# Patient Record
Sex: Female | Born: 1965
Health system: Southern US, Community
[De-identification: ages and names within clinical notes are randomized; demographics above are authoritative.]

## PROBLEM LIST (undated history)

## (undated) DIAGNOSIS — F32A Depression, unspecified: Secondary | ICD-10-CM

## (undated) DIAGNOSIS — E0590 Thyrotoxicosis, unspecified without thyrotoxic crisis or storm: Secondary | ICD-10-CM

## (undated) DIAGNOSIS — F329 Major depressive disorder, single episode, unspecified: Secondary | ICD-10-CM

## (undated) HISTORY — DX: Thyrotoxicosis, unspecified without thyrotoxic crisis or storm: E05.90

## (undated) HISTORY — PX: BRAIN SURGERY: SHX531

---
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
## 2014-04-22 ENCOUNTER — Ambulatory Visit (INDEPENDENT_AMBULATORY_CARE_PROVIDER_SITE_OTHER): Payer: Self-pay | Admitting: Endocrinology

## 2014-04-22 ENCOUNTER — Encounter: Payer: Self-pay | Admitting: Endocrinology

## 2014-04-22 VITALS — BP 124/80 | HR 54 | Temp 97.7°F | Ht 67.0 in | Wt 147.0 lb

## 2014-04-22 DIAGNOSIS — E0590 Thyrotoxicosis, unspecified without thyrotoxic crisis or storm: Secondary | ICD-10-CM

## 2014-04-22 MED ORDER — METHIMAZOLE 10 MG PO TABS: ORAL_TABLET | ORAL | Status: DC

## 2014-04-22 NOTE — Patient Instructions (Signed)
i have sent a prescription to your pharmacy, for a pill to slightly slow down the thyroid. if ever you have fever while taking methimazole, stop it and call us, because of the risk of a rare side-effect.   Please recheck the blood test in 4-6 weeks. Here is a letter to do it close to your home.  Please come back for a follow-up appointment in 6 months.

## 2014-04-22 NOTE — Progress Notes (Signed)
Subjective:    Patient ID: Shelley Hines, female    DOB: 1965/11/20, 48 y.o.   MRN: [id number]  HPI Pt was dx'ed with hypothyroidism in 2012.  She started taking synthroid, but she stopped it in late 2014, due to lack of insurance. She now reports slightly excessive diaphoresis throughout the body, at night, and assoc fatigue.   Past Medical History  Diagnosis Date  . Primary hyperthyroidism     No past surgical history on file.  History   Social History  . Marital Status: Divorced    Spouse Name: N/A    Number of Children: N/A  . Years of Education: N/A   Occupational History  . Not on file.   Social History Main Topics  . Smoking status: Never Smoker   . Smokeless tobacco: Not on file  . Alcohol Use: Yes  . Drug Use: Not on file  . Sexual Activity: Not on file   Other Topics Concern  . Not on file   Social History Narrative  . No narrative on file    No current outpatient prescriptions on file prior to visit.   No current facility-administered medications on file prior to visit.    Allergies  Allergen Reactions  . Codeine Hives, Itching and Swelling  . Fentanyl Citrate Itching  . Hydrocodone-Acetaminophen Hives, Itching and Swelling  . Oxycodone-Acetaminophen Hives, Itching, Nausea And Vomiting and Swelling  . Penicillins Hives, Itching and Swelling  . Tramadol Hives, Itching and Swelling  . Tramadol-Acetaminophen Hives, Itching and Swelling    Family History  Problem Relation Age of Onset  . Thyroid disease Neg Hx     BP 124/80  Pulse 54  Temp(Src) 97.7 F (36.5 C) (Oral)  Ht 5\' 7"  (1.702 m)  Wt 147 lb (66.679 kg)  BMI 23.02 kg/m2  SpO2 97%   Review of Systems denies weight loss, headache, hoarseness, double vision, palpitations, sob, diarrhea, polyuria, myalgias, numbness, tremor, anxiety, and rhinorrhea.  She still has menses.  She has easy bruising.    Objective:   Physical Exam VS: see vs page GEN: no distress HEAD: head: no  deformity eyes: no periorbital swelling; there is slight bilat proptosis external nose and ears are normal mouth: no lesion seen NECK: supple, thyroid is not enlarged CHEST WALL: no deformity LUNGS:  Clear to auscultation CV: reg rate and rhythm; soft systolic murmur ABD: abdomen is soft, nontender.  no hepatosplenomegaly.  not distended.  no hernia. MUSCULOSKELETAL: muscle bulk and strength are grossly normal.  no obvious joint swelling.  gait is normal and steady EXTEMITIES: no deformity.  no ulcer on the feet.  feet are of normal color and temp.  no edema PULSES: dorsalis pedis intact bilat.  no carotid bruit NEURO:  cn 2-12 grossly intact.   readily moves all 4's.  sensation is intact to touch on the feet.  No tremor SKIN:  Normal texture and temperature.  No rash or suspicious lesion is visible.  Not diaphoretic. NODES:  None palpable at the neck PSYCH: alert, well-oriented.  Does not appear anxious nor depressed.    outside test results are reviewed: TSH=0.23  i have reviewed the following outside records: Office notes     Assessment & Plan:  Hyperthyroidism, mild, new Excessive diaphoresis, uncertain if related to DM. Hypothyroidism, spontaneously resolved.  It is uncommon for autoimmune hypothyroidism to precede hyperthyroidism, but it happens.    Patient is advised the following: Patient Instructions  i have sent a prescription to your pharmacy,  for a pill to slightly slow down the thyroid. if ever you have fever while taking methimazole, stop it and call us, because of the risk of a rare side-effect.   Please recheck the blood test in 4-6 weeks. Here is a letter to do it close to your home.  Please come back for a follow-up appointment in 6 months.

## 2014-04-24 ENCOUNTER — Encounter: Payer: Self-pay | Admitting: Endocrinology

## 2014-04-24 DIAGNOSIS — E0590 Thyrotoxicosis, unspecified without thyrotoxic crisis or storm: Secondary | ICD-10-CM | POA: Insufficient documentation

## 2014-07-01 ENCOUNTER — Other Ambulatory Visit: Payer: Self-pay | Admitting: Endocrinology

## 2014-08-13 ENCOUNTER — Other Ambulatory Visit: Payer: Self-pay | Admitting: Endocrinology

## 2014-09-11 ENCOUNTER — Other Ambulatory Visit: Payer: Self-pay | Admitting: Endocrinology

## 2015-06-14 ENCOUNTER — Encounter (INDEPENDENT_AMBULATORY_CARE_PROVIDER_SITE_OTHER): Payer: Self-pay | Admitting: *Deleted

## 2016-03-22 DIAGNOSIS — Z6821 Body mass index (BMI) 21.0-21.9, adult: Secondary | ICD-10-CM | POA: Diagnosis not present

## 2016-03-22 DIAGNOSIS — K625 Hemorrhage of anus and rectum: Secondary | ICD-10-CM | POA: Diagnosis not present

## 2017-01-02 DIAGNOSIS — E039 Hypothyroidism, unspecified: Secondary | ICD-10-CM | POA: Diagnosis not present

## 2017-01-02 DIAGNOSIS — Z0000 Encounter for general adult medical examination without abnormal findings: Secondary | ICD-10-CM | POA: Diagnosis not present

## 2017-01-15 DIAGNOSIS — Z1389 Encounter for screening for other disorder: Secondary | ICD-10-CM | POA: Diagnosis not present

## 2017-01-15 DIAGNOSIS — Z6823 Body mass index (BMI) 23.0-23.9, adult: Secondary | ICD-10-CM | POA: Diagnosis not present

## 2017-01-15 DIAGNOSIS — Z01419 Encounter for gynecological examination (general) (routine) without abnormal findings: Secondary | ICD-10-CM | POA: Diagnosis not present

## 2017-06-20 DIAGNOSIS — E039 Hypothyroidism, unspecified: Secondary | ICD-10-CM | POA: Diagnosis not present

## 2017-12-18 DIAGNOSIS — E039 Hypothyroidism, unspecified: Secondary | ICD-10-CM | POA: Diagnosis not present

## 2017-12-22 DIAGNOSIS — L710 Perioral dermatitis: Secondary | ICD-10-CM | POA: Diagnosis not present

## 2017-12-22 DIAGNOSIS — F329 Major depressive disorder, single episode, unspecified: Secondary | ICD-10-CM | POA: Diagnosis not present

## 2017-12-22 DIAGNOSIS — E039 Hypothyroidism, unspecified: Secondary | ICD-10-CM | POA: Diagnosis not present

## 2017-12-30 DIAGNOSIS — E039 Hypothyroidism, unspecified: Secondary | ICD-10-CM | POA: Diagnosis not present

## 2017-12-30 DIAGNOSIS — E041 Nontoxic single thyroid nodule: Secondary | ICD-10-CM | POA: Diagnosis not present

## 2018-01-08 DIAGNOSIS — E041 Nontoxic single thyroid nodule: Secondary | ICD-10-CM | POA: Diagnosis not present

## 2018-03-17 ENCOUNTER — Other Ambulatory Visit: Payer: Self-pay | Admitting: Physician Assistant

## 2018-03-17 DIAGNOSIS — Z1231 Encounter for screening mammogram for malignant neoplasm of breast: Secondary | ICD-10-CM

## 2018-03-17 DIAGNOSIS — Z6822 Body mass index (BMI) 22.0-22.9, adult: Secondary | ICD-10-CM | POA: Diagnosis not present

## 2018-03-17 DIAGNOSIS — Z0000 Encounter for general adult medical examination without abnormal findings: Secondary | ICD-10-CM | POA: Diagnosis not present

## 2018-03-17 DIAGNOSIS — Z01419 Encounter for gynecological examination (general) (routine) without abnormal findings: Secondary | ICD-10-CM | POA: Diagnosis not present

## 2018-03-30 ENCOUNTER — Ambulatory Visit: Payer: BLUE CROSS/BLUE SHIELD | Admitting: Obstetrics & Gynecology

## 2018-03-30 ENCOUNTER — Encounter: Payer: Self-pay | Admitting: Obstetrics & Gynecology

## 2018-03-30 VITALS — BP 126/78 | HR 60 | Ht 68.0 in

## 2018-03-30 DIAGNOSIS — K5904 Chronic idiopathic constipation: Secondary | ICD-10-CM

## 2018-03-30 MED ORDER — POLYETHYLENE GLYCOL 3350 17 GM/SCOOP PO POWD: ORAL | 11 refills | Status: DC

## 2018-03-30 NOTE — Progress Notes (Signed)
Chief Complaint  Patient presents with  . Vaginal Prolapse      52 y.o. G7P0 No LMP recorded. Patient is postmenopausal. The current method of family planning is post menopausal status.  Outpatient Encounter Medications as of 03/30/2018  Medication Sig  . buPROPion (WELLBUTRIN XL) 300 MG 24 hr tablet Take 150 mg by mouth.   . Cholecalciferol (VITAMIN D-3) 5000 units TABS Take by mouth.  . Ginseng 100 MG CAPS Take by mouth.  . levothyroxine (SYNTHROID, LEVOTHROID) 50 MCG tablet Take by mouth.  . methimazole (TAPAZOLE) 10 MG tablet TAKE ONE-HALF TABLET BY MOUTH ONCE DAILY (Patient not taking: Reported on 03/30/2018)  . polyethylene glycol powder (GLYCOLAX/MIRALAX) powder 1 scoop daily or as needed   No facility-administered encounter medications on file as of 03/30/2018.     Subjective Shelley Hines presneted to the office with the concern that she had pelvic prolapse due to fell or sensing and occasionally seeing a bulge in her vagina No dyspareunia noted No significant pelvic pressure or change in bladder habits Has noticed an increase in her difficulty with BMs no splinting Past Medical History:  Diagnosis Date  . Primary hyperthyroidism     Past Surgical History:  Procedure Laterality Date  . BRAIN SURGERY    . CESAREAN SECTION      OB History    Gravida  7   Para      Term      Preterm      AB      Living  6     SAB      TAB      Ectopic      Multiple      Live Births              Allergies  Allergen Reactions  . Codeine Hives, Itching and Swelling  . Fentanyl Citrate Itching  . Hydrocodone-Acetaminophen Hives, Itching and Swelling  . Oxycodone-Acetaminophen Hives, Itching, Nausea And Vomiting and Swelling  . Penicillins Hives, Itching and Swelling  . Tramadol Hives, Itching and Swelling  . Tramadol-Acetaminophen Hives, Itching and Swelling    Social History   Socioeconomic History  . Marital status: Divorced    Spouse name:  Not on file  . Number of children: Not on file  . Years of education: Not on file  . Highest education level: Not on file  Occupational History  . Not on file  Social Needs  . Financial resource strain: Not on file  . Food insecurity:    Worry: Not on file    Inability: Not on file  . Transportation needs:    Medical: Not on file    Non-medical: Not on file  Tobacco Use  . Smoking status: Never Smoker  . Smokeless tobacco: Never Used  Substance and Sexual Activity  . Alcohol use: Not Currently  . Drug use: Never  . Sexual activity: Yes    Birth control/protection: None  Lifestyle  . Physical activity:    Days per week: Not on file    Minutes per session: Not on file  . Stress: Not on file  Relationships  . Social connections:    Talks on phone: Not on file    Gets together: Not on file    Attends religious service: Not on file    Active member of club or organization: Not on file    Attends meetings of clubs or organizations: Not on file    Relationship status: Not  on file  Other Topics Concern  . Not on file  Social History Narrative  . Not on file    Family History  Problem Relation Age of Onset  . Thyroid disease Neg Hx     Medications:       Current Outpatient Medications:  .  buPROPion (WELLBUTRIN XL) 300 MG 24 hr tablet, Take 150 mg by mouth. , Disp: , Rfl:  .  Cholecalciferol (VITAMIN D-3) 5000 units TABS, Take by mouth., Disp: , Rfl:  .  Ginseng 100 MG CAPS, Take by mouth., Disp: , Rfl:  .  levothyroxine (SYNTHROID, LEVOTHROID) 50 MCG tablet, Take by mouth., Disp: , Rfl:  .  methimazole (TAPAZOLE) 10 MG tablet, TAKE ONE-HALF TABLET BY MOUTH ONCE DAILY (Patient not taking: Reported on 03/30/2018), Disp: 15 tablet, Rfl: 0 .  polyethylene glycol powder (GLYCOLAX/MIRALAX) powder, 1 scoop daily or as needed, Disp: 255 g, Rfl: 11  Objective Blood pressure 126/78, pulse 60, height 5\' 8"  (1.727 m).  General WDWN female NAD Vulva:  normal appearing vulva with  no masses, tenderness or lesions Vagina:  normal mucosa, no discharge, no anterior apical or posterior prolapse, mucsoa hypoestrogenic Cervix:  no cervical motion tenderness and no lesions Uterus:  normal size, contour, position, consistency, mobility, non-tender Adnexa: ovaries:present,  normal adnexa in size, nontender and no masses   Pertinent ROS No burning with urination, frequency or urgency No nausea, vomiting or diarrhea Nor fever chills or other constitutional symptoms   Labs or studies none    Impression Diagnoses this Encounter::   ICD-10-CM   1. Chronic idiopathic constipation K59.04     Established relevant diagnosis(es): Menopausal changes   Plan/Recommendations: Meds ordered this encounter  Medications  . polyethylene glycol powder (GLYCOLAX/MIRALAX) powder    Sig: 1 scoop daily or as needed    Dispense:  255 g    Refill:  11    Labs or Scans Ordered: No orders of the defined types were placed in this encounter.   Management:: Pt is definitely in the perimenopausal transition time She has evidence of decreased estrogenic effect on the vaginal mucosa She has no prolapse at all in any compartment  but does have terminal rectal constipation  Will approach in layers, start with miralax powder and then introduce some vaginal estrogen to help support the tissue as well  Follow up Return in about 2 months (around 05/31/2018) for Follow up, with Dr Despina HiddenEure.     All questions were answered.

## 2018-04-10 ENCOUNTER — Ambulatory Visit
Admission: RE | Admit: 2018-04-10 | Discharge: 2018-04-10 | Disposition: A | Discharge status: Home / Self Care | Payer: BLUE CROSS/BLUE SHIELD | Source: Ambulatory Visit | Attending: Physician Assistant | Admitting: Physician Assistant

## 2018-04-10 DIAGNOSIS — Z1231 Encounter for screening mammogram for malignant neoplasm of breast: Secondary | ICD-10-CM | POA: Diagnosis not present

## 2018-06-02 ENCOUNTER — Ambulatory Visit: Payer: BLUE CROSS/BLUE SHIELD | Admitting: Obstetrics & Gynecology

## 2018-06-15 ENCOUNTER — Ambulatory Visit: Payer: BLUE CROSS/BLUE SHIELD | Admitting: Obstetrics & Gynecology

## 2018-06-22 ENCOUNTER — Ambulatory Visit: Payer: BLUE CROSS/BLUE SHIELD | Admitting: Obstetrics & Gynecology

## 2018-06-25 ENCOUNTER — Ambulatory Visit: Payer: Self-pay | Admitting: Obstetrics & Gynecology

## 2018-07-02 ENCOUNTER — Ambulatory Visit: Payer: Self-pay | Admitting: Obstetrics & Gynecology

## 2019-07-21 ENCOUNTER — Other Ambulatory Visit: Payer: Self-pay | Admitting: Physician Assistant

## 2019-07-21 DIAGNOSIS — Z23 Encounter for immunization: Secondary | ICD-10-CM | POA: Diagnosis not present

## 2019-07-21 DIAGNOSIS — Z1231 Encounter for screening mammogram for malignant neoplasm of breast: Secondary | ICD-10-CM

## 2019-07-21 DIAGNOSIS — Z6823 Body mass index (BMI) 23.0-23.9, adult: Secondary | ICD-10-CM | POA: Diagnosis not present

## 2019-07-21 DIAGNOSIS — Z0000 Encounter for general adult medical examination without abnormal findings: Secondary | ICD-10-CM | POA: Diagnosis not present

## 2019-07-21 DIAGNOSIS — Z01419 Encounter for gynecological examination (general) (routine) without abnormal findings: Secondary | ICD-10-CM | POA: Diagnosis not present

## 2019-07-21 DIAGNOSIS — M19041 Primary osteoarthritis, right hand: Secondary | ICD-10-CM | POA: Diagnosis not present

## 2019-08-08 ENCOUNTER — Encounter (HOSPITAL_COMMUNITY): Payer: Self-pay | Admitting: Emergency Medicine

## 2019-08-08 ENCOUNTER — Emergency Department (EMERGENCY_DEPARTMENT_HOSPITAL)
Admission: EM | Admit: 2019-08-08 | Discharge: 2019-08-09 | Disposition: A | Discharge status: Other Acute Inpatient Hospital | Payer: BC Managed Care – PPO | Source: Home / Self Care | Attending: Emergency Medicine | Admitting: Emergency Medicine

## 2019-08-08 ENCOUNTER — Other Ambulatory Visit: Payer: Self-pay

## 2019-08-08 DIAGNOSIS — T450X2A Poisoning by antiallergic and antiemetic drugs, intentional self-harm, initial encounter: Secondary | ICD-10-CM | POA: Insufficient documentation

## 2019-08-08 DIAGNOSIS — T43222A Poisoning by selective serotonin reuptake inhibitors, intentional self-harm, initial encounter: Secondary | ICD-10-CM | POA: Diagnosis not present

## 2019-08-08 DIAGNOSIS — T39312A Poisoning by propionic acid derivatives, intentional self-harm, initial encounter: Secondary | ICD-10-CM | POA: Insufficient documentation

## 2019-08-08 DIAGNOSIS — T50902A Poisoning by unspecified drugs, medicaments and biological substances, intentional self-harm, initial encounter: Secondary | ICD-10-CM

## 2019-08-08 DIAGNOSIS — Z20828 Contact with and (suspected) exposure to other viral communicable diseases: Secondary | ICD-10-CM | POA: Insufficient documentation

## 2019-08-08 DIAGNOSIS — Z79899 Other long term (current) drug therapy: Secondary | ICD-10-CM | POA: Insufficient documentation

## 2019-08-08 DIAGNOSIS — F1010 Alcohol abuse, uncomplicated: Secondary | ICD-10-CM | POA: Insufficient documentation

## 2019-08-08 DIAGNOSIS — R45851 Suicidal ideations: Secondary | ICD-10-CM | POA: Insufficient documentation

## 2019-08-08 DIAGNOSIS — T43212A Poisoning by selective serotonin and norepinephrine reuptake inhibitors, intentional self-harm, initial encounter: Secondary | ICD-10-CM | POA: Insufficient documentation

## 2019-08-08 DIAGNOSIS — Z03818 Encounter for observation for suspected exposure to other biological agents ruled out: Secondary | ICD-10-CM | POA: Diagnosis not present

## 2019-08-08 DIAGNOSIS — F332 Major depressive disorder, recurrent severe without psychotic features: Secondary | ICD-10-CM | POA: Diagnosis not present

## 2019-08-08 DIAGNOSIS — T1491XA Suicide attempt, initial encounter: Secondary | ICD-10-CM | POA: Diagnosis not present

## 2019-08-08 HISTORY — DX: Depression, unspecified: F32.A

## 2019-08-08 LAB — RAPID URINE DRUG SCREEN, HOSP PERFORMED
Amphetamines: NOT DETECTED
Barbiturates: NOT DETECTED
Benzodiazepines: NOT DETECTED
Cocaine: NOT DETECTED
Opiates: NOT DETECTED
Tetrahydrocannabinol: NOT DETECTED

## 2019-08-08 LAB — COMPREHENSIVE METABOLIC PANEL
ALT: 30 U/L (ref 0–44)
ALT: 28 U/L (ref 0–44)
AST: 35 U/L (ref 15–41)
AST: 31 U/L (ref 15–41)
Albumin: 4.3 g/dL (ref 3.5–5.0)
Albumin: 3.9 g/dL (ref 3.5–5.0)
Alkaline Phosphatase: 54 U/L (ref 38–126)
Alkaline Phosphatase: 50 U/L (ref 38–126)
Anion gap: 13 (ref 5–15)
Anion gap: 8 (ref 5–15)
BUN: 15 mg/dL (ref 6–20)
BUN: 12 mg/dL (ref 6–20)
CO2: 23 mmol/L (ref 22–32)
CO2: 24 mmol/L (ref 22–32)
Calcium: 9.6 mg/dL (ref 8.9–10.3)
Calcium: 8.7 mg/dL — ABNORMAL LOW (ref 8.9–10.3)
Chloride: 101 mmol/L (ref 98–111)
Chloride: 105 mmol/L (ref 98–111)
Creatinine, Ser: 0.75 mg/dL (ref 0.44–1.00)
Creatinine, Ser: 0.75 mg/dL (ref 0.44–1.00)
GFR calc Af Amer: >60 mL/min (ref >60)
GFR calc Af Amer: >60 mL/min (ref >60)
GFR calc non Af Amer: >60 mL/min (ref >60)
GFR calc non Af Amer: >60 mL/min (ref >60)
Glucose, Bld: 120 mg/dL — ABNORMAL HIGH (ref 70–99)
Glucose, Bld: 99 mg/dL (ref 70–99)
Potassium: 4.2 mmol/L (ref 3.5–5.1)
Potassium: 4.3 mmol/L (ref 3.5–5.1)
Sodium: 137 mmol/L (ref 135–145)
Sodium: 137 mmol/L (ref 135–145)
Total Bilirubin: 0.8 mg/dL (ref 0.3–1.2)
Total Bilirubin: 0.9 mg/dL (ref 0.3–1.2)
Total Protein: 7.3 g/dL (ref 6.5–8.1)
Total Protein: 6.6 g/dL (ref 6.5–8.1)

## 2019-08-08 LAB — CBC
HCT: 39.7 % (ref 36.0–46.0)
Hemoglobin: 13.1 g/dL (ref 12.0–15.0)
MCH: 31.2 pg (ref 26.0–34.0)
MCHC: 33 g/dL (ref 30.0–36.0)
MCV: 94.5 fL (ref 80.0–100.0)
Platelets: 233 10*3/uL (ref 150–400)
RBC: 4.2 MIL/uL (ref 3.87–5.11)
RDW: 12.7 % (ref 11.5–15.5)
WBC: 15.2 10*3/uL — ABNORMAL HIGH (ref 4.0–10.5)
nRBC: 0 % (ref 0.0–0.2)

## 2019-08-08 LAB — CBG MONITORING, ED: Glucose-Capillary: 122 mg/dL — ABNORMAL HIGH (ref 70–99)

## 2019-08-08 LAB — ETHANOL: Alcohol, Ethyl (B): <10 mg/dL (ref <10)

## 2019-08-08 LAB — ACETAMINOPHEN LEVEL: Acetaminophen (Tylenol), Serum: <10 ug/mL — ABNORMAL LOW (ref 10–30)

## 2019-08-08 LAB — SALICYLATE LEVEL: Salicylate Lvl: <7 mg/dL (ref 2.8–30.0)

## 2019-08-08 MED ORDER — LORAZEPAM 2 MG/ML IJ SOLN
1.0000 mg | Freq: Once | INTRAMUSCULAR | Status: AC
  Administered 2019-08-08: 1 mg via INTRAVENOUS
  Filled 2019-08-08: qty 1

## 2019-08-08 MED ORDER — SODIUM CHLORIDE 0.9 % IV BOLUS
1000.0000 mL | Freq: Once | INTRAVENOUS | Status: AC
  Administered 2019-08-08: 1000 mL via INTRAVENOUS

## 2019-08-08 NOTE — ED Provider Notes (Signed)
Patient cleared by behavioral health.  Still denoting suicidal thoughts but they say he can be put on suicide watch at the jail jail authorities okay with this.  Patient cleared medically from the fentanyl overdose.  Patient stable for [facility] the jail.  Patient's IVC rescinded.   Fredia Sorrow, MD 08/08/19 1757

## 2019-08-08 NOTE — ED Notes (Signed)
Update to poison control.

## 2019-08-08 NOTE — BHH Counselor (Addendum)
IVC paperwork to be faxed.   Addendum: Per Shelley Hines, at Shelley Hines. Pt is not IVC'd.  Vertell Novak, [place], [facility], [facility] Triage Specialist [phone]

## 2019-08-08 NOTE — ED Notes (Signed)
Pr rook an intentional OD yesterday of several substances including ETOH  She denies previous inpt admission, or current therapist  She is monitored   Labs drawn   She is alert, tremulous and awaiting med clearance for TTS and dispo

## 2019-08-08 NOTE — ED Notes (Signed)
ED Provider at bedside. 

## 2019-08-08 NOTE — BHH Counselor (Signed)
Pt's fiancee' wants to to be updated on pt's disposition. Clinician expressed the pt has to consent for him to be updated.   Vertell Novak, [place], [facility], [facility] Triage Specialist [phone]

## 2019-08-08 NOTE — ED Notes (Signed)
Second EKG noted to have pt in a fib

## 2019-08-08 NOTE — ED Notes (Signed)
Pt meets inpt criteria per [facility].

## 2019-08-08 NOTE — ED Provider Notes (Addendum)
[facility] EMERGENCY DEPARTMENT Provider Note   CSN: [id number] Arrival date & time: 08/08/19  1604     History   Chief Complaint Chief Complaint  Patient presents with  . Drug Overdose    HPI Shelley Hines is a 53 y.o. female.     Pt is reported by her fiance to have taken a handful of benadryl, celexa and ibuprofen.  Fiance reports pt drank a box of wine.  Pt reports she only recalls taking a bunch of pills.  Pt reports she was trying to commit suicide.  Fiance reports   The history is provided by the patient and the spouse. No language interpreter was used.  Drug Overdose This is a new problem. The current episode started yesterday. The problem occurs constantly. Nothing relieves the symptoms. She has tried nothing for the symptoms. The treatment provided no relief.  Fiance reports pt has had 2 seizures since he found her today.   Past Medical History:  Diagnosis Date  . Depression   . Primary hyperthyroidism     Patient Active Problem List   Diagnosis Date Noted  . Thyrotoxicosis without mention of goiter or other cause, without mention of thyrotoxic crisis or storm 04/24/2014    Past Surgical History:  Procedure Laterality Date  . BRAIN SURGERY    . CESAREAN SECTION       OB History    Gravida  7   Para      Term      Preterm      AB      Living  6     SAB      TAB      Ectopic      Multiple      Live Births               Home Medications    Prior to Admission medications   Medication Sig Start Date End Date Taking? Authorizing Provider  buPROPion (WELLBUTRIN XL) 300 MG 24 hr tablet Take 150 mg by mouth.  02/16/13   [provider]  Cholecalciferol (VITAMIN D-3) 5000 units TABS Take by mouth.    [provider]  Ginseng 100 MG CAPS Take by mouth.    [provider]  levothyroxine (SYNTHROID, LEVOTHROID) 50 MCG tablet Take by mouth. 02/16/13   [provider]  methimazole (TAPAZOLE) 10 MG tablet  TAKE ONE-HALF TABLET BY MOUTH ONCE DAILY Patient not taking: Reported on 03/30/2018 09/12/14   Renato Shin, MD  polyethylene glycol powder [facility]) powder 1 scoop daily or as needed 03/30/18   Florian Buff, MD    Family History Family History  Problem Relation Age of Onset  . Thyroid disease Neg Hx     Social History Social History   Tobacco Use  . Smoking status: Never Smoker  . Smokeless tobacco: Never Used  Substance Use Topics  . Alcohol use: Yes    Comment: occasionally  . Drug use: Yes    Types: Marijuana     Allergies   Codeine, Fentanyl citrate, Hydrocodone-acetaminophen, Oxycodone-acetaminophen, Penicillins, Tramadol, and Tramadol-acetaminophen   Review of Systems Review of Systems  All other systems reviewed and are negative.    Physical Exam Updated Vital Signs BP (!) 145/82   Pulse 97   Temp 98.3 F (36.8 C) (Oral)   Resp 17   Ht 5\' 7"  (1.702 m)   Wt 63.5 kg   SpO2 100%   BMI 21.93 kg/m   Physical Exam Vitals  and nursing note reviewed.  Constitutional:      Appearance: She is well-developed.  HENT:     Head: Normocephalic.     Mouth/Throat:     Mouth: Mucous membranes are moist.  Cardiovascular:     Rate and Rhythm: Normal rate and regular rhythm.  Pulmonary:     Effort: Pulmonary effort is normal.     Breath sounds: Normal breath sounds.  Abdominal:     General: Abdomen is flat. There is no distension.  Musculoskeletal:        General: Normal range of motion.     Cervical back: Normal range of motion.  Skin:    General: Skin is warm.  Neurological:     General: No focal deficit present.     Mental Status: She is alert and oriented to person, place, and time.  Psychiatric:        Mood and Affect: Mood normal.      ED Treatments / Results  Labs (all labs ordered are listed, but only abnormal results are displayed) Labs Reviewed  COMPREHENSIVE METABOLIC PANEL - Abnormal; Notable for the following components:       Result Value   Glucose, Bld 120 (*)    All other components within normal limits  CBC - Abnormal; Notable for the following components:   WBC 15.2 (*)    All other components within normal limits  CBG MONITORING, ED - Abnormal; Notable for the following components:   Glucose-Capillary 122 (*)    All other components within normal limits  ETHANOL  SALICYLATE LEVEL  ACETAMINOPHEN LEVEL  RAPID URINE DRUG SCREEN, HOSP PERFORMED    EKG EKG Interpretation  Date/Time:  Sunday August 08 2019 16:24:27 EST Ventricular Rate:  91 PR Interval:    QRS Duration: 95 QT Interval:  382 QTC Calculation: 470 R Axis:   68 Text Interpretation: Sinus rhythm Borderline repolarization abnormality no previous Confirmed by Vanetta Mulders [phone]) on 08/08/2019 4:43:55 PM   Radiology No results found.  Procedures Procedures (including critical care time)  Medications Ordered in ED Medications - No data to display   Initial Impression / Assessment and Plan / ED Course  I have reviewed the triage vital signs and the nursing notes.  Pertinent labs & imaging results that were available during my care of the patient were reviewed by me and considered in my medical decision making (see chart for details).        MDM  WBC's 15.2 Etoh negative, drug screen negative.  EKG no acute abnormality.  Poison control advised monitoring for 6 hours.  If no changes will consult TTS at 6 hour mark  Pt's care turned turned over to [facility] PA  Final Clinical Impressions(s) / ED Diagnoses   Final diagnoses:  Intentional drug overdose, initial encounter [facility])  Suicide attempt [facility])    ED Discharge Orders    None       Osie Cheeks 08/08/19 2141    Vanetta Mulders, MD 08/08/19 2357    Elson Areas, PA-C 08/16/19 1704    Vanetta Mulders, MD 08/16/19 2318

## 2019-08-08 NOTE — ED Notes (Addendum)
Poison Control notified and gave report on pt's ingestion.   Recommendations are:  -serial EKGs every 4-6 hours to monitor for dysrhythmias, QRS widening and QTc lengthening -Tylenol level -CMP level to check electrolytes, liver and renal function -Monitor for seizure activity, HTN -Give Benzos for seizures and agitation -Monitor I&O -Cardiac monitoring for a minimum of 6 hours
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
## 2019-08-08 NOTE — ED Notes (Signed)
Pt reports she does not know why she is tremulous   She then admits to 1/2 to 1 bottle of wine and reports no wine since last night   She has piloerection and tremulous hands but not tremulous tongue
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
## 2019-08-08 NOTE — ED Provider Notes (Signed)
Previous note dictated by me is in error.  Patient is not cleared for discharge home.  IVC has not been rescinded.   Fredia Sorrow, MD 08/08/19 1800

## 2019-08-08 NOTE — ED Notes (Signed)
Pt has declined her meal stating she cooks nightly amd this food will make her sick

## 2019-08-08 NOTE — BHH Counselor (Signed)
Pt consented for clinician to call her fiance' Gillian Scarce ([phone]) to obtain additional information.   Per fiance, he's not sure when the pt took the pills or drank the wine, but the pt disappears. Pt's fiance' reported, they have acres of land, the pt sometimes is in the dog kennel, the basement, etc. Per fiance, he was suppose to go to work at noon but hadn't seen the pt, so he checked the basement, the pt wasn't there. Per fiance, he seen the pt staggering across the yard, she had mud on her clothes, and was "wild-eyed," he the pt gave a bath and some food (bread and water.) Pt's fiance' reported, the pt had two seizures. Pt's fiance' reported, the pt drinks twice a week but when she does drink she will be drunk in 20 minutes. Per fiance, he feels the pt needs to get help.   Vertell Novak, [place], [facility], [facility] Triage Specialist [phone]
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
## 2019-08-08 NOTE — BH Assessment (Addendum)
Tele Assessment Note   Patient Name: Karsyn Jamie MRN: [id number] Referring Physician: Fransico Meadow, PA-C. Location of Patient: [facility], [phone].  Location of Provider: [facility]  Rashi Granier is an 53 y.o. female, who presents voluntary and unaccompanied to [facility]. Clinician asked the pt, "what brought you to the hospital?" Pt reported, she was bummed out because of her fiance' the way he talks to her; so she took some pills and drank a box of wine, not to come back. Pt reported, this was her first and last suicide attempt. Per pt, last night, she took a handful of Celexa, a little bit of Ibuprofen and drank a box of wine. Pt reported having tremors, not being able to walk straight and not hearing well since taking the pills. Pt reported, she feels like she does everything for every one but not for herself. 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 Pt denies, current SI, HI, AVH, self-injurious behaviors and access to weapons.  Pt reported, drinking every 2 weeks. Pt's BAL was <10. Pt's UDS is negative. Pt is linked to Dr. Aggie Hacker (PCP) at [facility], for medication management. Pt reported, she most recent seen her PCP last month. Pt reported, wanting to be linked to [facility]. Pt denies, previous inpatient admissions.   Pt presents quiet, awake with logical, coherent speech. At times during the assessment pt had to repeat questions asked. Pt's eye contact was fair. Pt's mood, affect was depressed. Pt's thought process was coherent, relevant. Pt's judgment was partial. Pt was oriented x4. Pt's concentration was normal. Pt's insight was fair. Pt's impulse control was poor. Pt reported, if discharged from [facility] she could contract for safety. Clinician discussed the three possible dispositions (discharged with OPT resources, observe/reassess by psychiatry or inpatient treatment) in  detail. Pt reported, if inpatient treatment is recommended she would sign-in voluntarily.   Diagnosis: Major Depressive Disorder, recurrent, severe without psychotic features.                      Alcohol use Disorder, severe.  Past Medical History:  Past Medical History:  Diagnosis Date  . Depression   . Primary hyperthyroidism     Past Surgical History:  Procedure Laterality Date  . BRAIN SURGERY    . CESAREAN SECTION      Family History:  Family History  Problem Relation Age of Onset  . Thyroid disease Neg Hx     Social History:  reports that she has never smoked. She has never used smokeless tobacco. She reports current alcohol use. She reports current drug use. Drug: Marijuana.  Additional Social History:  Alcohol / Drug Use Pain Medications: See MAR Prescriptions: See MAR Over the Counter: See MAR History of alcohol / drug use?: Yes Withdrawal Symptoms: Tremors Substance #1 Name of Substance 1: Alcohol. 1 - Age of First Use: UTA 1 - Amount (size/oz): Pt reported, drinking a box of wine, last night. Pt BAL was <10. 1 - Frequency: Pt reported, drinking every 2 weeks. 1 - Duration: Ongoing. 1 - Last Use / Amount: Last night (08/07/2019)  CIWA: CIWA-Ar BP: 128/72 Pulse Rate: 86 Nausea and Vomiting: no nausea and no vomiting Tactile Disturbances: none Tremor: two Auditory Disturbances: not present Paroxysmal Sweats: no sweat visible Visual Disturbances: not present Anxiety: mildly anxious Headache, Fullness in Head: none present Agitation: somewhat more than normal activity Orientation  and Clouding of Sensorium: oriented and can do serial additions CIWA-Ar Total: 4 COWS:    Allergies:  Allergies  Allergen Reactions  . Codeine Hives, Itching and Swelling  . Fentanyl Citrate Itching  . Hydrocodone-Acetaminophen Hives, Itching and Swelling  . Oxycodone-Acetaminophen Hives, Itching, Nausea And Vomiting and Swelling  . Penicillins Hives, Itching and Swelling   . Tramadol Hives, Itching and Swelling  . Tramadol-Acetaminophen Hives, Itching and Swelling    Home Medications: (Not in a hospital admission)   OB/GYN Status:  No LMP recorded. Patient is postmenopausal.  General Assessment Data Location of Assessment: [facility] TTS Assessment: In system Is this a Tele or Face-to-Face Assessment?: Tele Assessment Is this an Initial Assessment or a Re-assessment for this encounter?: Initial Assessment Patient Accompanied by:: N/A Language Other than English: No Living Arrangements: Other (Comment)(Fiance'.) What gender do you identify as?: Female Marital status: Divorced Living Arrangements: Spouse/significant other Can pt return to current living arrangement?: Yes Admission Status: Voluntary Is patient capable of signing voluntary admission?: Yes Referral Source: Self/Family/Friend Insurance type: BCBS.     Crisis Care Plan Living Arrangements: Spouse/significant other Legal Guardian: Other:(Self. ) Name of Psychiatrist: Dr. Daria Pastures (PCP) at [facility].  Name of Therapist: NA     Risk to self with the past 6 months Suicidal Ideation: No-Not Currently/Within Last 6 Months Has patient been a risk to self within the past 6 months prior to admission? : Yes Suicidal Intent: No-Not Currently/Within Last 6 Months Has patient had any suicidal intent within the past 6 months prior to admission? : Yes Is patient at risk for suicide?: Yes Suicidal Plan?: No-Not Currently/Within Last 6 Months Has patient had any suicidal plan within the past 6 months prior to admission? : Yes Access to Means: Yes Specify Access to Suicidal Means: Pill, alcohol.  What has been your use of drugs/alcohol within the last 12 months?: UDS is negative.  Previous Attempts/Gestures: Yes How many times?: 1 Other Self Harm Risks: Pt denies.  Triggers for Past Attempts: Other (Comment)(Per pt, "bummed out." ) Intentional Self Injurious  Behavior: None(Pt denies. ) Family Suicide History: No Recent stressful life event(s): Other (Comment)(Fiance', doing everytihng for everyone. ) Persecutory voices/beliefs?: No Depression: Yes Depression Symptoms: Feeling worthless/self pity, Fatigue, Isolating, Tearfulness, Despondent Substance abuse history and/or treatment for substance abuse?: Yes Suicide prevention information given to non-admitted patients: Not applicable  Risk to Others within the past 6 months Homicidal Ideation: No(Pt denies. ) Does patient have any lifetime risk of violence toward others beyond the six months prior to admission? : No(Pt denies. ) Thoughts of Harm to Others: No Current Homicidal Intent: No Current Homicidal Plan: No Access to Homicidal Means: No Identified Victim: NA History of harm to others?: No(Pt denies. ) Assessment of Violence: None Noted Violent Behavior Description: NA Does patient have access to weapons?: No(Pt denies. ) Criminal Charges Pending?: No Does patient have a court date: No Is patient on probation?: No  Psychosis Hallucinations: None noted(Pt denies. ) Delusions: None noted(Pt denies. )  Mental Status Report Appearance/Hygiene: Unremarkable Eye Contact: Fair Motor Activity: Unremarkable Speech: Logical/coherent Level of Consciousness: Quiet/awake Mood: Depressed Affect: Depressed Anxiety Level: Minimal Thought Processes: Coherent, Relevant Judgement: Partial Orientation: Person, Place, Time, Situation Obsessive Compulsive Thoughts/Behaviors: None  Cognitive Functioning Concentration: Normal Memory: Recent Intact Is patient IDD: No Insight: Fair Impulse Control: Poor Appetite: Poor Sleep: Decreased("Not much." ) Vegetative Symptoms: Staying in bed  ADLScreening [facility] Assessment Services) Patient's cognitive ability adequate to safely  complete daily activities?: Yes Patient able to express need for assistance with ADLs?: Yes Independently performs  ADLs?: Yes (appropriate for developmental age)  Prior Inpatient Therapy Prior Inpatient Therapy: No  Prior Outpatient Therapy Prior Outpatient Therapy: Yes Prior Therapy Dates: Current.  Prior Therapy Facilty/Provider(s): [facility]  Reason for Treatment: Medication management.  Does patient have an ACCT team?: No Does patient have Intensive In-House Services?  : No Does patient have [facility] services? : No Does patient have P4CC services?: No  ADL Screening (condition at time of admission) Patient's cognitive ability adequate to safely complete daily activities?: Yes Is the patient deaf or have difficulty hearing?: No Does the patient have difficulty seeing, even when wearing glasses/contacts?: Yes(Pt reported, using reading glasses.) Patient able to express need for assistance with ADLs?: Yes Does the patient have difficulty dressing or bathing?: No Independently performs ADLs?: Yes (appropriate for developmental age) Does the patient have difficulty walking or climbing stairs?: No Weakness of Legs: None Weakness of Arms/Hands: None  Home Assistive Devices/Equipment Home Assistive Devices/Equipment: (Pt using reading glasses.)    Abuse/Neglect Assessment (Assessment to be complete while patient is alone) Abuse/Neglect Assessment Can Be Completed: Yes Physical Abuse: Denies Verbal Abuse: Denies Sexual Abuse: Denies Exploitation of patient/patient's resources: Denies Self-Neglect: Denies     Merchant navy officerAdvance Directives (For Healthcare) Does Patient Have a Medical Advance Directive?: No          Disposition: Nira ConnJason Berry, NP recommends inpatient treatment. TTS to seek placement. Disposition discussed with [place], RN. RN to discuss disposition with EDP.    Disposition Initial Assessment Completed for this Encounter: Yes  This service was provided via telemedicine using a 2-way, interactive audio and video  technology.  Names of all persons participating in this telemedicine service and their role in this encounter. Name: Philippa Sickslodie Choo. Role: Patient.  Name: Redmond Pullingreylese D Cathrine Krizan, MS, [facility], [facility]. Role: Counselor.           Redmond Pullingreylese D Audreena Sachdeva 08/08/2019 10:51 PM    Redmond Pullingreylese D Madeline Pho, MS, [facility], [facility] Triage Specialist [phone]

## 2019-08-09 ENCOUNTER — Inpatient Hospital Stay (HOSPITAL_COMMUNITY)
Admission: AD | Admit: 2019-08-09 | Discharge: 2019-08-12 | DRG: 897 | Disposition: A | Discharge status: Home / Self Care | Payer: BC Managed Care – PPO | Source: Intra-hospital | Attending: Psychiatry | Admitting: Psychiatry

## 2019-08-09 ENCOUNTER — Encounter (HOSPITAL_COMMUNITY): Payer: Self-pay

## 2019-08-09 DIAGNOSIS — F10229 Alcohol dependence with intoxication, unspecified: Secondary | ICD-10-CM | POA: Diagnosis present

## 2019-08-09 DIAGNOSIS — Z03818 Encounter for observation for suspected exposure to other biological agents ruled out: Secondary | ICD-10-CM | POA: Diagnosis not present

## 2019-08-09 DIAGNOSIS — T1491XA Suicide attempt, initial encounter: Secondary | ICD-10-CM

## 2019-08-09 DIAGNOSIS — G4700 Insomnia, unspecified: Secondary | ICD-10-CM | POA: Diagnosis present

## 2019-08-09 DIAGNOSIS — T43222A Poisoning by selective serotonin reuptake inhibitors, intentional self-harm, initial encounter: Secondary | ICD-10-CM | POA: Diagnosis present

## 2019-08-09 DIAGNOSIS — F419 Anxiety disorder, unspecified: Secondary | ICD-10-CM | POA: Diagnosis present

## 2019-08-09 DIAGNOSIS — Z20828 Contact with and (suspected) exposure to other viral communicable diseases: Secondary | ICD-10-CM | POA: Diagnosis present

## 2019-08-09 DIAGNOSIS — F1024 Alcohol dependence with alcohol-induced mood disorder: Principal | ICD-10-CM | POA: Diagnosis present

## 2019-08-09 DIAGNOSIS — T450X2A Poisoning by antiallergic and antiemetic drugs, intentional self-harm, initial encounter: Secondary | ICD-10-CM | POA: Diagnosis not present

## 2019-08-09 DIAGNOSIS — F332 Major depressive disorder, recurrent severe without psychotic features: Secondary | ICD-10-CM

## 2019-08-09 DIAGNOSIS — T39312A Poisoning by propionic acid derivatives, intentional self-harm, initial encounter: Secondary | ICD-10-CM | POA: Diagnosis present

## 2019-08-09 DIAGNOSIS — Z880 Allergy status to penicillin: Secondary | ICD-10-CM | POA: Diagnosis not present

## 2019-08-09 LAB — SARS CORONAVIRUS 2 BY RT PCR (HOSPITAL ORDER, PERFORMED IN ~~LOC~~ HOSPITAL LAB): SARS Coronavirus 2: NEGATIVE

## 2019-08-09 LAB — ACETAMINOPHEN LEVEL: Acetaminophen (Tylenol), Serum: <10 ug/mL — ABNORMAL LOW (ref 10–30)

## 2019-08-09 MED ORDER — IBUPROFEN 600 MG PO TABS: 600.0000 mg | ORAL_TABLET | Freq: Four times a day (QID) | ORAL | Status: DC | PRN

## 2019-08-09 MED ORDER — ALUM & MAG HYDROXIDE-SIMETH 200-200-20 MG/5ML PO SUSP: 30.0000 mL | ORAL | Status: DC | PRN

## 2019-08-09 MED ORDER — MAGNESIUM HYDROXIDE 400 MG/5ML PO SUSP: 30.0000 mL | Freq: Every day | ORAL | Status: DC | PRN

## 2019-08-09 MED ORDER — TRAZODONE HCL 50 MG PO TABS
50.0000 mg | ORAL_TABLET | Freq: Every evening | ORAL | Status: DC | PRN
  Filled 2019-08-09 (×2): qty 1

## 2019-08-09 NOTE — Progress Notes (Signed)
Pt accepted to [facility], bed 304-1  Dr. Dwyane Dee is the accepting provider.    Dr. Parke Poisson is the attending provider.    Call report to [phone]    Kristi @ [facility] ED notified.     Pt is voluntary and can be transported by [employer].     Pt is scheduled to arrive at [facility] after her Covid test results return as negative.  Audree Camel, LCSW, [place] Disposition [facility] BHH/TTS [phone]

## 2019-08-09 NOTE — ED Notes (Signed)
[facility]ed [facility] to come transport pt to [facility]

## 2019-08-09 NOTE — Progress Notes (Signed)
   08/09/19 2105  Psych Admission Type (Psych Patients Only)  Admission Status Voluntary  Psychosocial Assessment  Patient Complaints Depression  Eye Contact Brief  Facial Expression Anxious  Affect Anxious  Speech Logical/coherent  Interaction Assertive  Motor Activity Slow  Appearance/Hygiene In scrubs  Behavior Characteristics Cooperative  Mood Anxious  Thought Process  Coherency WDL  Content WDL  Delusions None reported or observed  Perception WDL  Hallucination None reported or observed  Judgment Impaired  Confusion None  Danger to Self  Current suicidal ideation? Denies  Danger to Others  Danger to Others None reported or observed  D: Patient in room on approach. Pt appears irritable stating she does not need to be here and it was a misunderstanding. Pt does not dispute feeling depressed and going into the woods with a gun.  A: Support and encouragement provided as needed.  R: Patient remains safe on the unit. Will continue to monitor for safety and stability.

## 2019-08-09 NOTE — Progress Notes (Signed)
CSW left HIPAA compliant voice message with Gillian Scarce, pt's partner, in an attempt to gain collateral information. A return phone call was requested.   Audree Camel, LCSW, [place] Disposition [facility] BHH/TTS [phone]

## 2019-08-09 NOTE — Progress Notes (Signed)
Pt meets inpatient criteria based on collateral gained from pt's partner of 7 years, Gillian Scarce. In addition to pt's attempted overdose last night, taking medications with alcohol, Mr Kandice Robinsons stated that pt took his loaded handgun into the woods, which he has not been able to locate. He reports that she has never attempted to take her life in the past and voices great concern for her well-being.   Audree Camel, LCSW, [place] Disposition [facility] BHH/TTS [phone]

## 2019-08-09 NOTE — Consult Note (Signed)
Patient Identification:  [name] Date of Evaluation:  08/09/2019   History of Present Illness: Patient is a 53 year old female who presented to the ED as a walk-in, patient reported initially that she took some pills and drank a box of wine as she was trying to kill herself.  This morning on assessment, patient stated that she felt she needed a therapist, did not require any inpatient treatment, had never used a gun, felt she could keep herself safe.  Based on the collateral from patient's boyfriend, patient had taken a loaded 38 into the woods, took some pills along with alcohol, was missing at night and showed up in the morning walking back to the house all Monday.  Patient's boyfriend feels that patient is a risk to herself, seems irritable, overwhelmed, and adds that he has not been able to find the gun.  Past Psychiatric History:history of depression, no h/o inpatient psychiatric treatment   Past Medical History:     Past Medical History:  Diagnosis Date  . Depression   . Primary hyperthyroidism        Past Surgical History:  Procedure Laterality Date  . BRAIN SURGERY    . CESAREAN SECTION      Allergies:  Allergies  Allergen Reactions  . Codeine Hives, Itching and Swelling  . Fentanyl Citrate Itching  . Hydrocodone-Acetaminophen Hives, Itching and Swelling  . Oxycodone-Acetaminophen Hives, Itching, Nausea And Vomiting and Swelling  . Penicillins Hives, Itching and Swelling  . Tramadol Hives, Itching and Swelling  . Tramadol-Acetaminophen Hives, Itching and Swelling    Current Medications:  Prior to Admission medications   Medication Sig Start Date End Date Taking? Authorizing Provider  buPROPion (WELLBUTRIN XL) 150 MG 24 hr tablet Take 450 mg by mouth daily. 07/21/19  Yes [provider]  busPIRone (BUSPAR) 5 MG tablet Take 5 mg by mouth 2 (two) times daily. 07/21/19  Yes [provider]  Cholecalciferol (VITAMIN D-3) 5000 units TABS Take by  mouth.   Yes [provider]  Ginseng 100 MG CAPS Take by mouth.   Yes [provider]  methimazole (TAPAZOLE) 10 MG tablet TAKE ONE-HALF TABLET BY MOUTH ONCE DAILY Patient not taking: Reported on 03/30/2018 09/12/14   Renato Shin, MD  polyethylene glycol powder [facility]) powder 1 scoop daily or as needed Patient not taking: Reported on 08/09/2019 03/30/18   Florian Buff, MD    Social History:    reports that she has never smoked. She has never used smokeless tobacco. She reports current alcohol use. She reports current drug use. Drug: Marijuana.   Family History:    Family History  Problem Relation Age of Onset  . Thyroid disease Neg Hx      DIAGNOSIS:   MDD, recurrent, severe Assessment/Plan: Patient needs inpatient psychiatric admission

## 2019-08-09 NOTE — Progress Notes (Signed)
Admission Note: Patient is a 53 year old female who is admitted to the unit for suicidal ideation and depression.  Patient currently denies SI and states she's overwhelmed with what is going on in her life.  Per report, patient took some pills, drank a box of wine and went to the wood with a gun.  Patient is alert and oriented x 4.  Presents with anxious affect and mood.  States she's here to work on her self image.  Admission plan of care reviewed and consent signed.  Skin assessment and personal belongings completed.  Skin is dry and intact.  No contraband found.  Patient oriented to the unit, staff and room.  Routine safety checks initiated.  Verbalizes understanding of unit rules and protocols.  Patient is safe on the unit.

## 2019-08-09 NOTE — Tx Team (Signed)
Initial Treatment Plan 08/09/2019 5:42 PM [name] GGY:[id number]    PATIENT STRESSORS: Financial difficulties Health problems Marital or family conflict Substance abuse   PATIENT STRENGTHS: Ability for insight Average or above average intelligence Communication skills Supportive family/friends   PATIENT IDENTIFIED PROBLEMS: Suicidal ideation  Depression  Alcohol Abuse                 DISCHARGE CRITERIA:  Ability to meet basic life and health needs Adequate post-discharge living arrangements Motivation to continue treatment in a less acute level of care  PRELIMINARY DISCHARGE PLAN: Attend aftercare/continuing care group Outpatient therapy Return to previous living arrangement  PATIENT/FAMILY INVOLVEMENT: This treatment plan has been presented to and reviewed with the patient, Shelley Hines, and/or family member.  The patient and family have been given the opportunity to ask questions and make suggestions.  Vela Prose, RN 08/09/2019, 5:42 PM

## 2019-08-10 DIAGNOSIS — F1024 Alcohol dependence with alcohol-induced mood disorder: Secondary | ICD-10-CM

## 2019-08-10 LAB — TSH: TSH: 3.32 u[IU]/mL (ref 0.350–4.500)

## 2019-08-10 MED ORDER — ACAMPROSATE CALCIUM 333 MG PO TBEC
666.0000 mg | DELAYED_RELEASE_TABLET | Freq: Three times a day (TID) | ORAL | Status: DC
  Administered 2019-08-10 – 2019-08-12 (×7): 666 mg via ORAL
  Filled 2019-08-10 (×13): qty 2

## 2019-08-10 MED ORDER — ADULT MULTIVITAMIN W/MINERALS CH
1.0000 | ORAL_TABLET | Freq: Every day | ORAL | Status: DC
  Administered 2019-08-10 – 2019-08-12 (×3): 1 via ORAL
  Filled 2019-08-10 (×5): qty 1

## 2019-08-10 MED ORDER — THIAMINE HCL 100 MG/ML IJ SOLN: 100.0000 mg | Freq: Once | INTRAMUSCULAR | Status: DC

## 2019-08-10 MED ORDER — LORAZEPAM 1 MG PO TABS: 1.0000 mg | ORAL_TABLET | Freq: Four times a day (QID) | ORAL | Status: DC | PRN

## 2019-08-10 MED ORDER — VITAMIN B-1 100 MG PO TABS
100.0000 mg | ORAL_TABLET | Freq: Every day | ORAL | Status: DC
  Administered 2019-08-11 – 2019-08-12 (×2): 100 mg via ORAL
  Filled 2019-08-10 (×4): qty 1

## 2019-08-10 MED ORDER — HYDROXYZINE HCL 25 MG PO TABS: 25.0000 mg | ORAL_TABLET | Freq: Four times a day (QID) | ORAL | Status: DC | PRN

## 2019-08-10 MED ORDER — ONDANSETRON 4 MG PO TBDP: 4.0000 mg | ORAL_TABLET | Freq: Four times a day (QID) | ORAL | Status: DC | PRN

## 2019-08-10 NOTE — Progress Notes (Signed)
Recreation Therapy Notes  Animal-Assisted Activity (AAA) Program Checklist/Progress Notes Patient Eligibility Criteria Checklist & Daily Group note for Rec Tx Intervention  Date: 12.8.20 Time: 70 Location: [address]   AAA/T Program Assumption of Risk Form signed by Teacher, music or Parent Legal Guardian  YES   Patient is free of allergies or sever asthma YES   Patient reports no fear of animals YES   Patient reports no history of cruelty to animals YES   Patient understands his/her participation is voluntary YES   Patient washes hands before animal contact YES   Patient washes hands after animal contact YES   Behavioral Response: Engaged  Education: Contractor, Appropriate Animal Interaction   Education Outcome: Acknowledges understanding/In group clarification offered/Needs additional education.   Clinical Observations/Feedback:  Pt attended and participated in activity.   Victorino Sparrow, LRT/CTRS         Victorino Sparrow A 08/10/2019 3:34 PM

## 2019-08-10 NOTE — Progress Notes (Signed)
   08/10/19 2233  Psych Admission Type (Psych Patients Only)  Admission Status Voluntary  Psychosocial Assessment  Patient Complaints None  Eye Contact Brief  Facial Expression Anxious  Affect Anxious  Speech Logical/coherent  Interaction Assertive  Motor Activity Slow  Appearance/Hygiene In scrubs  Behavior Characteristics Cooperative  Mood Anxious  Thought Process  Coherency WDL  Content WDL  Delusions None reported or observed  Perception WDL  Hallucination None reported or observed  Judgment Impaired  Confusion None  Danger to Self  Current suicidal ideation? Denies  Danger to Others  Danger to Others None reported or observed  D: Patient visible in dayroom this evening.  A: Support and encouragement provided as needed.  R: Patient remains safe on the unit. Will continue to monitor for safety and stability.

## 2019-08-10 NOTE — [facility] Suicide Risk Assessment (Addendum)
[facility] Admission Suicide Risk Assessment   Nursing information obtained from:  Patient Demographic factors:  Low socioeconomic status Current Mental Status:  Self-harm thoughts Loss Factors:  Financial problems / change in socioeconomic status Historical Factors:  Impulsivity Risk Reduction Factors:  Positive social support  Total Time spent with patient: 45 minutes Principal Problem: Alcohol Use Disorder, Alcohol Induced Mood Disorder  Diagnosis:  Active Problems:   MDD (major depressive disorder), recurrent severe, without psychosis (HCC)  Subjective Data:   Continued Clinical Symptoms:  Alcohol Use Disorder Identification Test Final Score (AUDIT): 3 The "Alcohol Use Disorders Identification Test", Guidelines for Use in Primary Care, Second Edition.  World Science writer [facility]). Score between 0-7:  no or low risk or alcohol related problems. Score between 8-15:  moderate risk of alcohol related problems. Score between 16-19:  high risk of alcohol related problems. Score 20 or above:  warrants further diagnostic evaluation for alcohol dependence and treatment.   CLINICAL FACTORS:  53,  lives with fiance,  has 6 adult children ( ranging in ages from 67 to 57) , employed . Presented to ED with fiance, following an overdose on Celexa /NSAID ( Ibuprofen) /Alcohol. States she took " a handful of medication", unsure of quantity.  Describes  Overdose as impulsive and unplanned, triggered by argument with her fiance. After overdosing she states she informed her SO and was brought to ED ( of note, states she went to ED one day after overdose ). She reports she has been depressed recently, which she attributes in part to relationship stressors, but denies having had any suicidal or self injurious ideations leading up to above . Currently does not endorse neuro-vegetative symptoms - denies changes in sleep, appetite, energy level or anhedonia. Denies psychotic symptoms. Currently attributes  impulsive suicide attempt  to alcohol intoxication at the time. States " I think this all happened because I was drinking". She reports she has been drinking frequently, on most days of the week ( up to several glasses of wine per episode). Denies any drug abuse . 12/6 BAL negative, UDS negative. No prior psychiatric admissions, denies history of past suicide attempts, no history of self cutting or self injurious behaviors. Reports past history of depression. Denies history of mania. Denies history of psychosis. Denies history of PTSD. Denies GAD or Panic symptoms . Denies prior history of alcohol use disorder but does describe prior " bad things happen when I drink, I become more impulsive ". Denies history of blackouts, no history of DUIS, no history of WDL seizures . Denies history of severe withdrawal symptoms. Denies medical illnesses. Past history of hyperthyroidism , had been on Tapazole up to one year ago, states it was stopped as thyroid function normalized. Allergic to PCN and to several opiates  ( cause hives ) . Does not smoke . Home medications - Wellbutrin XL 450 mgrs QDAY - x 4 years.  Buspar 5 mgrs BID , which was started a few days prior to admission. States Wellbutrin has been helpful. * Currently not presenting with tremors, diaphoresis, or psychomotor restlessness. Vitals stable- 115/66, pulse 77.  Dx- Alcohol Use Disorder, Alcohol Induced Mood Disorder.   Plan- Inpatient admission. Patient is not currently presenting with symptoms of alcohol WDL- will start Ativan PRN for alcohol WDL as needed . Patient reports history of good response to Wellbutrin XL, which she has been on for several years. Will not restart it yet as both this medication and alcohol WDL  may decrease seizure threshold .  Expresses interest in Campral trial. Side effects reviewed .  Repeat EKG - NSR .     Musculoskeletal: Strength & Muscle Tone: within normal limits Gait & Station: normal Patient leans:  N/A  Psychiatric Specialty Exam: Physical Exam  ROS denies headache, no chest pain, no shortness of breath, no cough, no vomiting, no diarrhea, no rash   Blood pressure 115/66, pulse 77, temperature 98.1 F (36.7 C), resp. rate 18, height 5\' 7"  (1.702 m), weight 64.9 kg, SpO2 100 %.Body mass index is 22.4 kg/m.  General Appearance: Fairly Groomed  Eye Contact:  Good  Speech:  Normal Rate  Volume:  Normal  Mood:   not great"  Affect:  appropriate, vaguely constricted/irritable   Thought Process:  Linear and Descriptions of Associations: Intact  Orientation:  Full (Time, Place, and Person)  Thought Content:  denies hallucinations, no delusions   Suicidal Thoughts:  No denies suicidal or self injurious ideations, denies homicidal or violent ideations, contracts for safety on unit   Homicidal Thoughts:  No  Memory:  recent and remote grossly intact   Judgement:  Fair  Insight:  Fair  Psychomotor Activity:  Normal- no tremors, no diaphoresis, no restlessness , no psychomotor agitation  Concentration:  Concentration: Good and Attention Span: Good  Recall:  Good  Fund of Knowledge:  Good  Language:  Good  Akathisia:  Negative  Handed:  Right  AIMS (if indicated):     Assets:  Communication Skills Desire for Improvement Resilience  ADL's:  Intact  Cognition:  WNL  Sleep:  Number of Hours: 5      COGNITIVE FEATURES THAT CONTRIBUTE TO RISK:  Closed-mindedness and Loss of executive function    SUICIDE RISK:   Moderate:  Frequent suicidal ideation with limited intensity, and duration, some specificity in terms of plans, no associated intent, good self-control, limited dysphoria/symptomatology, some risk factors present, and identifiable protective factors, including available and accessible social support.  PLAN OF CARE: Patient will be admitted to inpatient psychiatric unit for stabilization and safety. Will provide and encourage milieu participation. Provide medication management  and maked adjustments as needed.  Will follow daily.    I certify that inpatient services furnished can reasonably be expected to improve the patient's condition.   Jenne Campus, MD 08/10/2019, 10:41 AM

## 2019-08-10 NOTE — [facility] Counselor (Signed)
Adult Comprehensive Assessment  Patient ID: Shelley Hines, female   DOB: 07-08-1966, 53 y.o.   MRN: [id number]  Information Source: Information source: Patient  Current Stressors:  Patient states their primary concerns and needs for treatment are:: "I made a huge mistake and had an altercation with my fiance. I had suicidal thoughts, but I changed my mind" Patient states their goals for this hospitilization and ongoing recovery are:: "I want to stop drinking, get back on my Wellbutrin and I'm going to start AA meetings when I leave here" Educational / Learning stressors: N/A Employment / Job issues: Employed; Denies any current stressors Family Relationships: Denies any current stressors Financial / Lack of resources (include bankruptcy): Denies any current stressors Housing / Lack of housing: Lives with her fiance' in [place], [place]; Denies any current stressors Physical health (include injuries & life threatening diseases): Denies any current stressors Social relationships: Reports having a recent argument with her fiance', which led to her worsening depressive symptoms prior to coming to the hospital. Substance abuse: Endorsed drinking ETOH daily. States she drinks one bottle of wine daily; Denies any other substance use Bereavement / Loss: Denies any current stressors  Living/Environment/Situation:  Living Arrangements: Spouse/significant other Living conditions (as described by patient or guardian): "Good" Who else lives in the home?: Fiance' How long has patient lived in current situation?: 7 years What is atmosphere in current home: Comfortable  Family History:  Marital status: Long term relationship Long term relationship, how long?: 7 years What types of issues is patient dealing with in the relationship?: Patient reports her alcohol use is the only issue in her relationship currently. Additional relationship information: Patient is employed by her fiance' Are you sexually  active?: Yes What is your sexual orientation?: Heterosexual Has your sexual activity been affected by drugs, alcohol, medication, or emotional stress?: No Does patient have children?: Yes How many children?: 6 How is patient's relationship with their children?: Patient reports having a "good" relationship with her six adult children.  Childhood History:  By whom was/is the patient raised?: Both parents Description of patient's relationship with caregiver when they were a child: Reports having a close relationship with her mother during her childhood, however she reports her father was mean and they had a distant relationship Patient's description of current relationship with people who raised him/her: Reports she and her mother continue to have a close relationship. Patient reports her father is currently deceased. How were you disciplined when you got in trouble as a child/adolescent?: "I went to [employer] school, so I had my fair share of spankings and verbal discipline" Does patient have siblings?: Yes Number of Siblings: 4 Description of patient's current relationship with siblings: Reports she is close with two of her sisters, however she reports not having a relationship with her remaining two sibings. Did patient suffer any verbal/emotional/physical/sexual abuse as a child?: No Did patient suffer from severe childhood neglect?: No Has patient ever been sexually abused/assaulted/raped as an adolescent or adult?: No Was the patient ever a victim of a crime or a disaster?: No Witnessed domestic violence?: No Has patient been effected by domestic violence as an adult?: No  Education:  Highest grade of school patient has completed: 12th grade Currently a student?: No Learning disability?: No  Employment/Work Situation:   Employment situation: Employed Where is patient currently employed?: [employer] How long has patient been employed?: 7 years Patient's job  has been impacted by current illness: No What is the longest time  patient has a held a job?: 7 years Where was the patient employed at that time?: Current job Did You Receive Any Psychiatric Treatment/Services While in Passenger transport manager?: No Are There Guns or Other Weapons in [place]?: Yes Types of Guns/Weapons: Patient does know what types of weapons her fiance' own; Reports she does not have any access to her fiance's weapons. Are These Weapons Safely Secured?: Yes  Financial Resources:   Financial resources: Income from employment, Income from spouse, Private insurance Does patient have a representative payee or guardian?: No  Alcohol/Substance Abuse:   What has been your use of drugs/alcohol within the last 12 months?: Endorsed drinking ETOH daily. States she drinks one bottle of wine daily; Denies any other substance use If attempted suicide, did drugs/alcohol play a role in this?: No Alcohol/Substance Abuse Treatment Hx: Denies past history Has alcohol/substance abuse ever caused legal problems?: No  Social Support System:   Patient's Community Support System: Good Describe Community Support System: "My fiance', his mother and my two sisters" Type of faith/religion: Catholic How does patient's faith help to cope with current illness?: Prayer  Leisure/Recreation:   Leisure and Hobbies: "Gardening, cooking and decorating"  Strengths/Needs:   What is the patient's perception of their strengths?: "I am a good person" Patient states they can use these personal strengths during their treatment to contribute to their recovery: Yes Patient states these barriers may affect/interfere with their treatment: No Patient states these barriers may affect their return to the community: No Other important information patient would like considered in planning for their treatment: No  Discharge Plan:   Currently receiving community mental health services: No(Sees her PCP, Dr. Aggie Hacker for  medication management services) Patient states concerns and preferences for aftercare planning are: Patient expressed interest in resources for AA Patient states they will know when they are safe and ready for discharge when: To be determined Does patient have access to transportation?: Yes Does patient have financial barriers related to discharge medications?: No Will patient be returning to same living situation after discharge?: Yes  Summary/Recommendations:   Summary and Recommendations (to be completed by the evaluator): Nyesha is a 53 year old female who is diagnosed with MDD (major depressive disorder), recurrent severe, without psychosis, Alcohol Use Disorder and Alcohol Induced Mood Disorder. She presented to the hospital seeking treatment for a suicide attempy by overdosing on medications. During the assessment, Rhya was pleasant and cooperative with providing information, however she was intermittently tearful while sharing information. Klee shared that she and her fiance got into a recent altercation that led to her taking an overdose on medications. [place] shared that she and her fiance only began arguing due to her intoxication on alcohol. Teren states that while she is in the hospital, she would like to receive medication management and resources for [employer]. Jakaylee states that she would like to continue to follow up with her PCP for medication management. Makaelah can benefit from crisis stabilization, medication management, therapeutic milieu, group therapy, psycho education and referral services.  Marylee Floras. 08/10/2019

## 2019-08-10 NOTE — Progress Notes (Signed)
DAR NOTE: Patient presents with anxious affect and mood.  Denies suicidal thoughts, pain, auditory and visual hallucinations.  Described energy level as normal and concentration as good.  Rates depression at 0, hopelessness at 0, and anxiety at 0.  Maintained on routine safety checks.  Medications given as prescribed.  Support and encouragement offered as needed.  Attended group and participated.  States goal for today is "my relationship with my fiance."  Patient observed socializing with peers in the dayroom.  Offered no complaint.

## 2019-08-10 NOTE — [facility] Group Notes (Signed)
Adult Psychoeducational Group Note  Date:  08/10/2019 Time:  9:17 PM  Group Topic/Focus:  Wrap-Up Group:   The focus of this group is to help patients review their daily goal of treatment and discuss progress on daily workbooks.  Participation Level:  Active  Participation Quality:  Appropriate and Attentive  Affect:  Appropriate  Cognitive:  Alert and Appropriate  Insight: Appropriate and Good  Engagement in Group:  Engaged  Modes of Intervention:  Discussion and Education  Additional Comments:  Pt attended and participated in wrap up group this evening and rated their day an 8/10. Pt had phone calls from loved ones and met nice people at [facility]. Pt goal is to get a discharge plan in progress.   Cristi Loron 08/10/2019, 9:17 PM

## 2019-08-10 NOTE — Progress Notes (Signed)
Adult Psychoeducational Group Note  Date:  08/10/2019 Time:  10:22 AM  Group Topic/Focus:  Goals Group:   The focus of this group is to help patients establish daily goals to achieve during treatment and discuss how the patient can incorporate goal setting into their daily lives to aide in recovery. Orientation:   The focus of this group is to educate the patient on the purpose and policies of crisis stabilization and provide a format to answer questions about their admission.  The group details unit policies and expectations of patients while admitted.  Participation Level:  Active  Participation Quality:  Appropriate  Affect:  Appropriate  Cognitive:  Alert  Insight: Appropriate  Engagement in Group:  Engaged  Modes of Intervention:  Discussion and Education  Additional Comments:    Pt participated in group. During group MHT discussed rules for the unit along with today's schedule. Today's topic of the day is mindfulness. MHT discussed what is mindfulness and different mindfulness techniques that patients can use to cope with anxiety and depression. Pt's goal today is work on setting boundaries. Pt states she has a difficult time saying no.    Lita Mains 08/10/2019, 10:22 AM

## 2019-08-10 NOTE — H&P (Addendum)
Psychiatric Admission Assessment Adult  Patient Identification: Shelley Hines MRN:  [id number] Date of Evaluation:  08/10/2019 Chief Complaint:  "I drank a whole bunch and did something stupid." Principal Diagnosis: <principal problem not specified> Diagnosis:  Active Problems:   MDD (major depressive disorder), recurrent severe, without psychosis ([place])   Alcohol dependence with alcohol-induced mood disorder ([place])  History of Present Illness: Shelley Hines is a 53 year old female with history of depression and alcohol use disorder, presenting after suicide attempt via overdose on Celexa and ibuprofen while intoxicated with alcohol. She became acutely distressed while she and her fiance were arguing. She drank a box of wine and then overdosed with a handful of Celexa and a handful of ibuprofen tablets and went into the woods. She realized she had made a mistake in the woods. She came back to the house, and her fiance and his mother brought her to the hospital. They stated she appeared to be having seizures after the overdose.  Per prior notes, fiance reported patient had taken his loaded handgun into the woods as well. She has history of depression but denies recent depression or suicidal ideation. She reports suicide attempt was impulsive and related to alcohol intoxication. She does admit to problems with alcohol. She reports drinking 4-5 glasses of red wine almost daily over the last month. She reports last drink was three days ago. She has tried to cut down, felt guilty, and been criticized by her fiance about her drinking. She denies prior suicide attempts but admits to prior impulsive mistakes while drinking and states "I need to quit." She denies history of DUIs, withdrawal symptoms, or withdrawal seizures. She does admit to history of blackouts. She reports adherence with Wellbutrin XL 450 mg daily, which she has found helpful. She denies withdrawal symptoms. Denies drug use. UDS negative. She  denies SI/HI/AVH.  Associated Signs/Symptoms: Depression Symptoms:  suicidal attempt, denies symptoms of depression (Hypo) Manic Symptoms:  denies Anxiety Symptoms:  Excessive Worry, Psychotic Symptoms:  denies PTSD Symptoms: Negative Total Time spent with patient: 30 minutes  Past Psychiatric History: History of alcohol use disorder with no prior treatment. No prior hospitalizations or suicide attempts. Treated for depression by PCP. No history of mania or psychosis.  Is the patient at risk to self? Yes.    Has the patient been a risk to self in the past 6 months? No.  Has the patient been a risk to self within the distant past? No.  Is the patient a risk to others? No.  Has the patient been a risk to others in the past 6 months? No.  Has the patient been a risk to others within the distant past? No.   Prior Inpatient Therapy:   Prior Outpatient Therapy:    Alcohol Screening: Patient refused Alcohol Screening Tool: Yes 1. How often do you have a drink containing alcohol?: Monthly or less 2. How many drinks containing alcohol do you have on a typical day when you are drinking?: 3 or 4 3. How often do you have six or more drinks on one occasion?: Less than monthly AUDIT-C Score: 3 4. How often during the last year have you found that you were not able to stop drinking once you had started?: Never 5. How often during the last year have you failed to do what was normally expected from you becasue of drinking?: Never 6. How often during the last year have you needed a first drink in the morning to get yourself going after a heavy  drinking session?: Never 7. How often during the last year have you had a feeling of guilt of remorse after drinking?: Never 8. How often during the last year have you been unable to remember what happened the night before because you had been drinking?: Never 9. Have you or someone else been injured as a result of your drinking?: No 10. Has a relative or  friend or a doctor or another health worker been concerned about your drinking or suggested you cut down?: No Alcohol Use Disorder Identification Test Final Score (AUDIT): 3 Alcohol Brief Interventions/Follow-up: Patient Refused Substance Abuse History in the last 12 months:  Yes.   Consequences of Substance Abuse: Family Consequences:  conflict with fiance Blackouts:  hx blackouts Previous Psychotropic Medications: Yes  Psychological Evaluations: No  Past Medical History:  Past Medical History:  Diagnosis Date  . Depression   . Primary hyperthyroidism     Past Surgical History:  Procedure Laterality Date  . BRAIN SURGERY    . CESAREAN SECTION     Family History:  Family History  Problem Relation Age of Onset  . Thyroid disease Neg Hx    Family Psychiatric  History: Father with possible alcohol use disorder. Tobacco Screening: Have you used any form of tobacco in the last 30 days? (Cigarettes, Smokeless Tobacco, Cigars, and/or Pipes): No Social History:  Social History   Substance and Sexual Activity  Alcohol Use Yes   Comment: occasionally     Social History   Substance and Sexual Activity  Drug Use Yes  . Types: Marijuana    Additional Social History: Marital status: Long term relationship Long term relationship, how long?: 7 years What types of issues is patient dealing with in the relationship?: Patient reports her alcohol use is the only issue in her relationship currently. Additional relationship information: Patient is employed by her fiance' Are you sexually active?: Yes What is your sexual orientation?: Heterosexual Has your sexual activity been affected by drugs, alcohol, medication, or emotional stress?: No Does patient have children?: Yes How many children?: 6 How is patient's relationship with their children?: Patient reports having a "good" relationship with her six adult children.                         Allergies:   Allergies  Allergen  Reactions  . Codeine Hives, Itching and Swelling  . Fentanyl Citrate Itching  . Hydrocodone-Acetaminophen Hives, Itching and Swelling  . Oxycodone-Acetaminophen Hives, Itching, Nausea And Vomiting and Swelling  . Penicillins Hives, Itching and Swelling  . Tramadol Hives, Itching and Swelling  . Tramadol-Acetaminophen Hives, Itching and Swelling   Lab Results:  Results for orders placed or performed during the hospital encounter of 08/09/19 (from the past 48 hour(s))  TSH     Status: None   Collection Time: 08/10/19  6:44 AM  Result Value Ref Range   TSH 3.320 0.350 - 4.500 uIU/mL    Comment: Performed by a 3rd Generation assay with a functional sensitivity of <=0.01 uIU/mL. Performed at [facility], [place] [address], [place], [place] [address]     Blood Alcohol level:  Lab Results  Component Value Date   ETH <10 54/62/7035    Metabolic Disorder Labs:  No results found for: HGBA1C, MPG No results found for: PROLACTIN No results found for: CHOL, TRIG, HDL, CHOLHDL, VLDL, LDLCALC  Current Medications: Current Facility-Administered Medications  Medication Dose Route Frequency Provider Last Rate Last Dose  . acamprosate (  CAMPRAL) tablet 666 mg  666 mg Oral TID WC Bufford Helms, Myer Peer, MD   666 mg at 08/10/19 1246  . alum & mag hydroxide-simeth (MAALOX/MYLANTA) 200-200-20 MG/5ML suspension 30 mL  30 mL Oral Q4H PRN Derrill Center, NP      . hydrOXYzine (ATARAX/VISTARIL) tablet 25 mg  25 mg Oral Q6H PRN Doc Mandala, Myer Peer, MD      . ibuprofen (ADVIL) tablet 600 mg  600 mg Oral Q6H PRN Derrill Center, NP      . LORazepam (ATIVAN) tablet 1 mg  1 mg Oral Q6H PRN Reyes Fifield, Myer Peer, MD      . magnesium hydroxide (MILK OF MAGNESIA) suspension 30 mL  30 mL Oral Daily PRN Derrill Center, NP      . multivitamin with minerals tablet 1 tablet  1 tablet Oral Daily Jameal Razzano, Myer Peer, MD   1 tablet at 08/10/19 1246  . ondansetron (ZOFRAN-ODT) disintegrating tablet 4 mg  4 mg Oral  Q6H PRN Terre Hanneman, Myer Peer, MD      . thiamine (B-1) injection 100 mg  100 mg Intramuscular Once Cullen Lahaie, Myer Peer, MD      . Derrill Memo ON 08/11/2019] thiamine (VITAMIN B-1) tablet 100 mg  100 mg Oral Daily Trevion Hoben A, MD      . traZODone (DESYREL) tablet 50 mg  50 mg Oral QHS PRN Derrill Center, NP       PTA Medications: Medications Prior to Admission  Medication Sig Dispense Refill Last Dose  . buPROPion (WELLBUTRIN XL) 150 MG 24 hr tablet Take 450 mg by mouth daily.     . busPIRone (BUSPAR) 5 MG tablet Take 5 mg by mouth 2 (two) times daily.     . Cholecalciferol (VITAMIN D-3) 5000 units TABS Take by mouth.     . Ginseng 100 MG CAPS Take by mouth.     . methimazole (TAPAZOLE) 10 MG tablet TAKE ONE-HALF TABLET BY MOUTH ONCE DAILY (Patient not taking: Reported on 03/30/2018) 15 tablet 0   . polyethylene glycol powder (GLYCOLAX/MIRALAX) powder 1 scoop daily or as needed (Patient not taking: Reported on 08/09/2019) 255 g 11     Musculoskeletal: Strength & Muscle Tone: within normal limits Gait & Station: normal Patient leans: N/A  Psychiatric Specialty Exam: Physical Exam  Nursing note and vitals reviewed. Constitutional: She is oriented to person, place, and time. She appears well-developed and well-nourished.  Cardiovascular: Normal rate.  Respiratory: Effort normal.  Neurological: She is alert and oriented to person, place, and time.    Review of Systems  Constitutional: Negative.   Respiratory: Negative for cough and shortness of breath.   Cardiovascular: Negative for chest pain.  Psychiatric/Behavioral: Positive for depression, substance abuse and suicidal ideas. Negative for hallucinations. The patient is nervous/anxious. The patient does not have insomnia.     Blood pressure 115/66, pulse 77, temperature 98.1 F (36.7 C), resp. rate 18, height '5\' 7"'  (1.702 m), weight 64.9 kg, SpO2 100 %.Body mass index is 22.4 kg/m.  General Appearance: Fairly Groomed  Eye Contact:  Good   Speech:  Normal Rate  Volume:  Normal  Mood:  Anxious  Affect:  Congruent and Tearful  Thought Process:  Coherent  Orientation:  Full (Time, Place, and Person)  Thought Content:  Logical  Suicidal Thoughts:  No  Homicidal Thoughts:  No  Memory:  Immediate;   Fair Recent;   Fair Remote;   Fair  Judgement:  Fair  Insight:  Fair  Psychomotor Activity:  Normal  Concentration:  Concentration: Fair and Attention Span: Fair  Recall:  [employer] of Knowledge:  Fair  Language:  Good  Akathisia:  No  Handed:  Right  AIMS (if indicated):     Assets:  Communication Skills Desire for Improvement Financial Resources/Insurance Housing Resilience Social Support  ADL's:  Intact  Cognition:  WNL  Sleep:  Number of Hours: 5    Treatment Plan Summary: Daily contact with patient to assess and evaluate symptoms and progress in treatment and Medication management   Inpatient hospitalization.  See MD's admission SRA for medication management.  Patient will participate in the therapeutic group milieu.  Discharge disposition in progress.   Observation Level/Precautions:  15 minute checks  Laboratory:  Reviewed  Psychotherapy:  Group therapy  Medications:  See MAR  Consultations:  PRN  Discharge Concerns:  Safety and stabilization  Estimated LOS: 3-5 days  Other:     Physician Treatment Plan for Primary Diagnosis: <principal problem not specified> Long Term Goal(s): Improvement in symptoms so as ready for discharge  Short Term Goals: Ability to identify changes in lifestyle to reduce recurrence of condition will improve, Ability to verbalize feelings will improve and Ability to disclose and discuss suicidal ideas  Physician Treatment Plan for Secondary Diagnosis: Active Problems:   MDD (major depressive disorder), recurrent severe, without psychosis ([place])   Alcohol dependence with alcohol-induced mood disorder ([place])  Long Term Goal(s): Improvement in symptoms so as ready for  discharge  Short Term Goals: Ability to demonstrate self-control will improve and Ability to identify and develop effective coping behaviors will improve  I certify that inpatient services furnished can reasonably be expected to improve the patient's condition.    Connye Burkitt, NP 12/8/20202:07 PM   I have discussed case with NP and have met with patient  Agree with NP note and assessment  57,  lives with fiance,  has 6 adult children ( ranging in ages from 70 to 3) , employed . Presented to ED with fiance, following an overdose on Celexa /NSAID ( Ibuprofen) /Alcohol. States she took " a handful of medication", unsure of quantity.  Describes  Overdose as impulsive and unplanned, triggered by argument with her fiance. After overdosing she states she informed her SO and was brought to ED ( of note, states she went to ED one day after overdose ). She reports she has been depressed recently, which she attributes in part to relationship stressors, but denies having had any suicidal or self injurious ideations leading up to above . Currently does not endorse neuro-vegetative symptoms - denies changes in sleep, appetite, energy level or anhedonia. Denies psychotic symptoms. Currently attributes impulsive suicide attempt  to alcohol intoxication at the time. States " I think this all happened because I was drinking". She reports she has been drinking frequently, on most days of the week ( up to several glasses of wine per episode). Denies any drug abuse . 12/6 BAL negative, UDS negative. No prior psychiatric admissions, denies history of past suicide attempts, no history of self cutting or self injurious behaviors. Reports past history of depression. Denies history of mania. Denies history of psychosis. Denies history of PTSD. Denies GAD or Panic symptoms . Denies prior history of alcohol use disorder but does describe prior " bad things happen when I drink, I become more impulsive ". Denies history of  blackouts, no history of DUIS, no history of WDL seizures . Denies history of severe withdrawal symptoms.  Denies medical illnesses. Past history of hyperthyroidism , had been on Tapazole up to one year ago, states it was stopped as thyroid function normalized. Allergic to PCN and to several opiates  ( cause hives ) . Does not smoke . Home medications - Wellbutrin XL 450 mgrs QDAY - x 4 years.  Buspar 5 mgrs BID , which was started a few days prior to admission. States Wellbutrin has been helpful. * Currently not presenting with tremors, diaphoresis, or psychomotor restlessness. Vitals stable- 115/66, pulse 77.  Dx- Alcohol Use Disorder, Alcohol Induced Mood Disorder.   Plan- Inpatient admission. Patient is not currently presenting with symptoms of alcohol WDL- will start Ativan PRN for alcohol WDL as needed . Patient reports history of good response to Wellbutrin XL, which she has been on for several years. Will not restart it yet as both this medication and alcohol WDL  may decrease seizure threshold . Expresses interest in Campral trial. Side effects reviewed .  Repeat EKG - NSR .

## 2019-08-11 DIAGNOSIS — F1024 Alcohol dependence with alcohol-induced mood disorder: Secondary | ICD-10-CM | POA: Diagnosis not present

## 2019-08-11 NOTE — B[facility] Counselor (Signed)
Per pt request, CSW provided her with AA support group meeting schedule.

## 2019-08-11 NOTE — Progress Notes (Signed)
D:  Patient's self inventory sheet, patient sleeps good, no sleep medication.  Good appetite, normal energy level, good concentration.  Patient denied depression, anxiety and hopeless.  Denied SI.  Denied physical problems.  Denied physical pain.  Needs to attend AA and see therapist.  Plans to stay positive.  Not sure of discharge plans. A:  Medications administered per MD orders.  Emotional support and encouragement given patient. R:  Patient denied SI and HI, contracts for safety.  Denied A/V hallucinations.  Safety maintained with 15 minute checks.

## 2019-08-11 NOTE — Plan of Care (Signed)
Nurse discussed anxiety, depression and coping skills with patient.  

## 2019-08-11 NOTE — Progress Notes (Addendum)
[facility] MD Progress Note  08/11/2019 11:01 AM Carolann Brazell  MRN:  [id number] Subjective:  "I'm ok."  Ms. Totino found sitting in the dayroom. She reports stable mood. She denies withdrawal symptoms. Last CIWA 0. She continues to state her plan is to quit drinking completely. She is not interested in rehab but plans to start attending AA. List of AA meeting schedule was provided by CSW this morning. She states that she plans to leave her fiance, due to feeling that he is too demanding and that she is not able to please him. She feels conflict in relationship with fiance has been primary trigger for her decompensation recently. She was started on Campral yesterday and denies medication side effects. Antidepressant medication was discussed with patient, but she continues to decline antidepressants at this time. She denies SI/HI/AVH. She has been visible in the unit milieu, interacting appropriately with others.  From admission H&P: Ms. Hoey is a 53 year old female with history of depression and alcohol use disorder, presenting after suicide attempt via overdose on Celexa and ibuprofen while intoxicated with alcohol. She drank a box of wine and then overdosed with a handful of Celexa and a handful of ibuprofen tablets and went into the woods. Per prior notes, fiance reported patient had taken his loaded handgun into the woods as well. She reports drinking 4-5 glasses of red wine almost daily over the last month.  Principal Problem: <principal problem not specified> Diagnosis: Active Problems:   MDD (major depressive disorder), recurrent severe, without psychosis (HCC)   Alcohol dependence with alcohol-induced mood disorder (HCC)  Total Time spent with patient: 15 minutes  Past Psychiatric History: See admission H&P  Past Medical History:  Past Medical History:  Diagnosis Date  . Depression   . Primary hyperthyroidism     Past Surgical History:  Procedure Laterality Date  . BRAIN SURGERY     . CESAREAN SECTION     Family History:  Family History  Problem Relation Age of Onset  . Thyroid disease Neg Hx    Family Psychiatric  History: See admission H&P Social History:  Social History   Substance and Sexual Activity  Alcohol Use Yes   Comment: occasionally     Social History   Substance and Sexual Activity  Drug Use Yes  . Types: Marijuana    Social History   Socioeconomic History  . Marital status: Divorced    Spouse name: Not on file  . Number of children: Not on file  . Years of education: Not on file  . Highest education level: Not on file  Occupational History  . Not on file  Social Needs  . Financial resource strain: Not on file  . Food insecurity    Worry: Not on file    Inability: Not on file  . Transportation needs    Medical: Not on file    Non-medical: Not on file  Tobacco Use  . Smoking status: Never Smoker  . Smokeless tobacco: Never Used  Substance and Sexual Activity  . Alcohol use: Yes    Comment: occasionally  . Drug use: Yes    Types: Marijuana  . Sexual activity: Yes    Birth control/protection: None  Lifestyle  . Physical activity    Days per week: Not on file    Minutes per session: Not on file  . Stress: Not on file  Relationships  . Social Musician on phone: Not on file    Gets together: Not on  file    Attends religious service: Not on file    Active member of club or organization: Not on file    Attends meetings of clubs or organizations: Not on file    Relationship status: Not on file  Other Topics Concern  . Not on file  Social History Narrative  . Not on file   Additional Social History:                         Sleep: Good  Appetite:  Good  Current Medications: Current Facility-Administered Medications  Medication Dose Route Frequency Provider Last Rate Last Dose  . acamprosate (CAMPRAL) tablet 666 mg  666 mg Oral TID WC Dyane Broberg, Rockey SituFernando A, MD   666 mg at 08/11/19 0620  . alum &  mag hydroxide-simeth (MAALOX/MYLANTA) 200-200-20 MG/5ML suspension 30 mL  30 mL Oral Q4H PRN Oneta RackLewis, Tanika N, NP      . hydrOXYzine (ATARAX/VISTARIL) tablet 25 mg  25 mg Oral Q6H PRN Huie Ghuman, Rockey SituFernando A, MD      . ibuprofen (ADVIL) tablet 600 mg  600 mg Oral Q6H PRN Oneta RackLewis, Tanika N, NP      . LORazepam (ATIVAN) tablet 1 mg  1 mg Oral Q6H PRN Marilena Trevathan, Rockey SituFernando A, MD      . magnesium hydroxide (MILK OF MAGNESIA) suspension 30 mL  30 mL Oral Daily PRN Oneta RackLewis, Tanika N, NP      . multivitamin with minerals tablet 1 tablet  1 tablet Oral Daily Schyler Counsell, Rockey SituFernando A, MD   1 tablet at 08/11/19 0745  . ondansetron (ZOFRAN-ODT) disintegrating tablet 4 mg  4 mg Oral Q6H PRN Kwesi Sangha, Rockey SituFernando A, MD      . thiamine (B-1) injection 100 mg  100 mg Intramuscular Once Auda Finfrock A, MD      . thiamine (VITAMIN B-1) tablet 100 mg  100 mg Oral Daily Kendle Erker, Rockey SituFernando A, MD   100 mg at 08/11/19 0745  . traZODone (DESYREL) tablet 50 mg  50 mg Oral QHS PRN Oneta RackLewis, Tanika N, NP        Lab Results:  Results for orders placed or performed during the hospital encounter of 08/09/19 (from the past 48 hour(s))  TSH     Status: None   Collection Time: 08/10/19  6:44 AM  Result Value Ref Range   TSH 3.320 0.350 - 4.500 uIU/mL    Comment: Performed by a 3rd Generation assay with a functional sensitivity of <=0.01 uIU/mL. Performed at [facility], [address] [place], [place] [address]     Blood Alcohol level:  Lab Results  Component Value Date   ETH <10 08/08/2019    Metabolic Disorder Labs: No results found for: HGBA1C, MPG No results found for: PROLACTIN No results found for: CHOL, TRIG, HDL, CHOLHDL, VLDL, LDLCALC  Physical Findings: AIMS: Facial and Oral Movements Muscles of Facial Expression: None, normal Lips and Perioral Area: None, normal Jaw: None, normal Tongue: None, normal,Extremity Movements Upper (arms, wrists, hands, fingers): None, normal Lower (legs, knees, ankles, toes):  None, normal, Trunk Movements Neck, shoulders, hips: None, normal, Overall Severity Severity of abnormal movements (highest score from questions above): None, normal Incapacitation due to abnormal movements: None, normal Patient's awareness of abnormal movements (rate only patient's report): No Awareness, Dental Status Current problems with teeth and/or dentures?: No Does patient usually wear dentures?: No  CIWA:  CIWA-Ar Total: 7 COWS:     Musculoskeletal: Strength & Muscle Tone: within  normal limits Gait & Station: normal Patient leans: N/A  Psychiatric Specialty Exam: Physical Exam  Nursing note and vitals reviewed. Constitutional: She is oriented to person, place, and time. She appears well-developed and well-nourished.  Cardiovascular: Normal rate.  Respiratory: Effort normal.  Neurological: She is alert and oriented to person, place, and time.    Review of Systems  Constitutional: Negative.   Respiratory: Negative for cough and shortness of breath.   Cardiovascular: Negative for chest pain.  Psychiatric/Behavioral: Positive for depression and substance abuse (ETOH). Negative for hallucinations and suicidal ideas. The patient is nervous/anxious. The patient does not have insomnia.     Blood pressure 123/83, pulse 79, temperature 98.4 F (36.9 C), resp. rate 16, height 5\' 7"  (1.702 m), weight 64.9 kg, SpO2 100 %.Body mass index is 22.4 kg/m.  General Appearance: Fairly Groomed  Eye Contact:  Good  Speech:  Normal Rate  Volume:  Normal  Mood:  Anxious  Affect:  Congruent  Thought Process:  Coherent  Orientation:  Full (Time, Place, and Person)  Thought Content:  Logical  Suicidal Thoughts:  No  Homicidal Thoughts:  No  Memory:  Immediate;   Fair Recent;   Fair  Judgement:  Fair  Insight:  Fair  Psychomotor Activity:  Normal  Concentration:  Concentration: Good and Attention Span: Fair  Recall:  [employer] of Knowledge:  Fair  Language:  Good  Akathisia:  No   Handed:  Right  AIMS (if indicated):     Assets:  Communication Skills Desire for Improvement Financial Resources/Insurance Resilience  ADL's:  Intact  Cognition:  WNL  Sleep:  Number of Hours: 6.25     Treatment Plan Summary: Daily contact with patient to assess and evaluate symptoms and progress in treatment and Medication management   Continue inpatient hospitalization.  Continue Ativan 1 mg PO Q6HR PRN CIWA>10 for ETOH withdrawal Continue Campral 666 mg PO TID for alcohol use disorder Continue Vistaril 25 mg PO Q6HR PRN anxiety Continue thiamine 100 mg PO daily for supplementation Continue trazodone 50 mg PO QHS PRN insomnia  Patient will participate in the therapeutic group milieu.  Discharge disposition in progress.   Connye Burkitt, NP 08/11/2019, 11:01 AM   Attest to NP note

## 2019-08-11 NOTE — Tx Team (Signed)
Interdisciplinary Treatment and Diagnostic Plan Update  08/11/2019 Time of Session: 9am Shelley Hines MRN: [id number]  Principal Diagnosis: <principal problem not specified>  Secondary Diagnoses: Active Problems:   MDD (major depressive disorder), recurrent severe, without psychosis ([place])   Alcohol dependence with alcohol-induced mood disorder (HCC)   Current Medications:  Current Facility-Administered Medications  Medication Dose Route Frequency Provider Last Rate Last Dose  . acamprosate (CAMPRAL) tablet 666 mg  666 mg Oral TID WC Cobos, Myer Peer, MD   666 mg at 08/11/19 0620  . alum & mag hydroxide-simeth (MAALOX/MYLANTA) 200-200-20 MG/5ML suspension 30 mL  30 mL Oral Q4H PRN Derrill Center, NP      . hydrOXYzine (ATARAX/VISTARIL) tablet 25 mg  25 mg Oral Q6H PRN Cobos, Myer Peer, MD      . ibuprofen (ADVIL) tablet 600 mg  600 mg Oral Q6H PRN Derrill Center, NP      . LORazepam (ATIVAN) tablet 1 mg  1 mg Oral Q6H PRN Cobos, Myer Peer, MD      . magnesium hydroxide (MILK OF MAGNESIA) suspension 30 mL  30 mL Oral Daily PRN Derrill Center, NP      . multivitamin with minerals tablet 1 tablet  1 tablet Oral Daily Cobos, Myer Peer, MD   1 tablet at 08/11/19 0745  . ondansetron (ZOFRAN-ODT) disintegrating tablet 4 mg  4 mg Oral Q6H PRN Cobos, Myer Peer, MD      . thiamine (B-1) injection 100 mg  100 mg Intramuscular Once Cobos, Fernando A, MD      . thiamine (VITAMIN B-1) tablet 100 mg  100 mg Oral Daily Cobos, Myer Peer, MD   100 mg at 08/11/19 0745  . traZODone (DESYREL) tablet 50 mg  50 mg Oral QHS PRN Derrill Center, NP       PTA Medications: Medications Prior to Admission  Medication Sig Dispense Refill Last Dose  . buPROPion (WELLBUTRIN XL) 150 MG 24 hr tablet Take 450 mg by mouth daily.     . busPIRone (BUSPAR) 5 MG tablet Take 5 mg by mouth 2 (two) times daily.     . Cholecalciferol (VITAMIN D-3) 5000 units TABS Take by mouth.     . Ginseng 100 MG CAPS Take by  mouth.     . methimazole (TAPAZOLE) 10 MG tablet TAKE ONE-HALF TABLET BY MOUTH ONCE DAILY (Patient not taking: Reported on 03/30/2018) 15 tablet 0   . polyethylene glycol powder (GLYCOLAX/MIRALAX) powder 1 scoop daily or as needed (Patient not taking: Reported on 08/09/2019) 255 g 11     Patient Stressors: Financial difficulties Health problems Marital or family conflict Substance abuse  Patient Strengths: Ability for insight Average or above average intelligence Communication skills Supportive family/friends  Treatment Modalities: Medication Management, Group therapy, Case management,  1 to 1 session with clinician, Psychoeducation, Recreational therapy.   Physician Treatment Plan for Primary Diagnosis: <principal problem not specified> Long Term Goal(s): Improvement in symptoms so as ready for discharge Improvement in symptoms so as ready for discharge   Short Term Goals: Ability to identify changes in lifestyle to reduce recurrence of condition will improve Ability to verbalize feelings will improve Ability to disclose and discuss suicidal ideas Ability to demonstrate self-control will improve Ability to identify and develop effective coping behaviors will improve  Medication Management: Evaluate patient's response, side effects, and tolerance of medication regimen.  Therapeutic Interventions: 1 to 1 sessions, Unit Group sessions and Medication administration.  Evaluation of Outcomes: Not Met  Physician Treatment Plan for Secondary Diagnosis: Active Problems:   MDD (major depressive disorder), recurrent severe, without psychosis ([place])   Alcohol dependence with alcohol-induced mood disorder ([place])  Long Term Goal(s): Improvement in symptoms so as ready for discharge Improvement in symptoms so as ready for discharge   Short Term Goals: Ability to identify changes in lifestyle to reduce recurrence of condition will improve Ability to verbalize feelings will improve Ability to  disclose and discuss suicidal ideas Ability to demonstrate self-control will improve Ability to identify and develop effective coping behaviors will improve     Medication Management: Evaluate patient's response, side effects, and tolerance of medication regimen.  Therapeutic Interventions: 1 to 1 sessions, Unit Group sessions and Medication administration.  Evaluation of Outcomes: Not Met   RN Treatment Plan for Primary Diagnosis: <principal problem not specified> Long Term Goal(s): Knowledge of disease and therapeutic regimen to maintain health will improve  Short Term Goals: Ability to participate in decision making will improve, Ability to verbalize feelings will improve, Ability to disclose and discuss suicidal ideas and Ability to identify and develop effective coping behaviors will improve  Medication Management: RN will administer medications as ordered by provider, will assess and evaluate patient's response and provide education to patient for prescribed medication. RN will report any adverse and/or side effects to prescribing provider.  Therapeutic Interventions: 1 on 1 counseling sessions, Psychoeducation, Medication administration, Evaluate responses to treatment, Monitor vital signs and CBGs as ordered, Perform/monitor CIWA, COWS, AIMS and Fall Risk screenings as ordered, Perform wound care treatments as ordered.  Evaluation of Outcomes: Not Met   LCSW Treatment Plan for Primary Diagnosis: <principal problem not specified> Long Term Goal(s): Safe transition to appropriate next level of care at discharge, Engage patient in therapeutic group addressing interpersonal concerns.  Short Term Goals: Engage patient in aftercare planning with referrals and resources  Therapeutic Interventions: Assess for all discharge needs, 1 to 1 time with Social worker, Explore available resources and support systems, Assess for adequacy in community support network, Educate family and significant  other(s) on suicide prevention, Complete Psychosocial Assessment, Interpersonal group therapy.  Evaluation of Outcomes: Not Met   Progress in Treatment: Attending groups: Yes. Participating in groups: Yes. Taking medication as prescribed: Yes. Toleration medication: Yes. Family/Significant other contact made: Yes, individual(s) contacted:  with the pt; declined family contact Patient understands diagnosis: Yes. Discussing patient identified problems/goals with staff: Yes. Medical problems stabilized or resolved: No. Denies suicidal/homicidal ideation: Yes. Issues/concerns per patient self-inventory: No. Other: NA  New problem(s) identified: No, Describe:  none reported  New Short Term/Long Term Goal(s): Attend outpatient treatment, take medication as prescribed, develop and implement healthy coping methods.  Patient Goals:  "Quit drinking"  Discharge Plan or Barriers: Pt will return home and follow up at [facility] where she is an existing patient  Reason for Continuation of Hospitalization: Medication stabilization  Estimated Length of Stay:1-7 days  Attendees: Patient:Shelley Hines 08/11/2019 9:36 AM  Physician:  08/11/2019 9:36 AM  Nursing: Harriett Sine 08/11/2019 9:36 AM  RN Care Manager: 08/11/2019 9:36 AM  Social Worker: Sanjuana Kava 08/11/2019 9:36 AM  Recreational Therapist:  08/11/2019 9:36 AM  Other:  08/11/2019 9:36 AM  Other:  08/11/2019 9:36 AM  Other: 08/11/2019 9:36 AM    Scribe for Treatment Team: Yvette Rack, LCSW 08/11/2019 9:36 AM

## 2019-08-11 NOTE — BHH Group Notes (Signed)
Occupational Therapy Group Note  Date:  08/11/2019 Time:  3:43 PM  Group Topic/Focus:  Self Esteem Action Plan:   The focus of this group is to help patients create a plan to continue to build self-esteem after discharge.  Participation Level:  Active  Participation Quality:  Appropriate  Affect:  Depressed  Cognitive:  Alert  Insight: Improving  Engagement in Group:  Engaged  Modes of Intervention:  Activity, Discussion, Education and Socialization  Additional Comments:    S: My self esteem is low  O: OT tx with focus on self esteem building this date. Education given on definition of self esteem, with both causes of low and high self esteem identified. Activity given for pt to identify a positive/aspiring trait for each letter of the alphabet. Pt to work with peers to help complete activity and build positive thinking.   A: Pt participatory in discussion, stating that her self esteem is decreased by judgements and increased by helping others. She completed A-Z activity in entirety and shared with group.  P: OT group will be x1 per week while pt inpatient  Zenovia Jarred, MSOT, OTR/L [place] Office: [place] 08/11/2019, 3:43 PM

## 2019-08-11 NOTE — BHH Group Notes (Signed)
We had AA this evening. Pt attended AA meeting this evening.  

## 2019-08-12 MED ORDER — BUSPIRONE HCL 5 MG PO TABS: 5.0000 mg | ORAL_TABLET | Freq: Two times a day (BID) | ORAL | 0 refills | Status: AC

## 2019-08-12 MED ORDER — HYDROXYZINE HCL 25 MG PO TABS: 25.0000 mg | ORAL_TABLET | Freq: Four times a day (QID) | ORAL | 0 refills | Status: DC | PRN

## 2019-08-12 MED ORDER — BUPROPION HCL ER (XL) 150 MG PO TB24
150.0000 mg | ORAL_TABLET | Freq: Every day | ORAL | Status: DC
  Administered 2019-08-12: 150 mg via ORAL
  Filled 2019-08-12 (×4): qty 1

## 2019-08-12 MED ORDER — ACAMPROSATE CALCIUM 333 MG PO TBEC: 666.0000 mg | DELAYED_RELEASE_TABLET | Freq: Three times a day (TID) | ORAL | 0 refills | Status: DC

## 2019-08-12 MED ORDER — BUSPIRONE HCL 5 MG PO TABS
5.0000 mg | ORAL_TABLET | Freq: Two times a day (BID) | ORAL | Status: DC
  Administered 2019-08-12: 5 mg via ORAL
  Filled 2019-08-12 (×6): qty 1

## 2019-08-12 MED ORDER — TRAZODONE HCL 50 MG PO TABS: 50.0000 mg | ORAL_TABLET | Freq: Every evening | ORAL | 0 refills | Status: DC | PRN

## 2019-08-12 MED ORDER — BUPROPION HCL ER (XL) 150 MG PO TB24: 150.0000 mg | ORAL_TABLET | Freq: Every day | ORAL | 0 refills | Status: DC

## 2019-08-12 NOTE — BHH Suicide Risk Assessment (Signed)
[facility] Discharge Suicide Risk Assessment   Principal Problem: Alcohol dependence with alcohol-induced mood disorder [facility]) Discharge Diagnoses: Principal Problem:   Alcohol dependence with alcohol-induced mood disorder ([place]) Active Problems:   MDD (major depressive disorder), recurrent severe, without psychosis ([place])   Total Time spent with patient: 30 minutes  Musculoskeletal: Strength & Muscle Tone: within normal limits- no tremors, no diaphoresis, no restlessness or agitation Gait & Station: normal Patient leans: N/A  Psychiatric Specialty Exam: Review of Systems no chest pain , no shortness of breath at room air, no cough, no fever or chills.  Blood pressure (!) 110/48, pulse 73, temperature 98.2 F (36.8 C), resp. rate 16, height 5\' 7"  (1.702 m), weight 64.9 kg, SpO2 100 %.Body mass index is 22.4 kg/m.  General Appearance: Well Groomed  Eye Contact::  Good  Speech:  Normal Rate409  Volume:  Normal  Mood:  improving mood, states " I am feeling better", currently presents euthymic  Affect:  Appropriate and Full Range  Thought Process:  Linear and Descriptions of Associations: Intact  Orientation:  Full (Time, Place, and Person)  Thought Content:  no hallucinations, no delusions, not internally preoccupied   Suicidal Thoughts:  No denies suicidal or self injurious ideations, denies homicidal or violent ideations  Homicidal Thoughts:  No  Memory:  recent and remote grossly intact   Judgement:  Other:  improving   Insight:  Fair/ improving   Psychomotor Activity:  Normal- no current psychomotor agitation or restlessness , no tremors, no diaphoresis, presents calm, comfortable   Concentration:  Good  Recall:  Good  Fund of Knowledge:Good  Language: Good  Akathisia:  Negative  Handed:  Right  AIMS (if indicated):     Assets:  Desire for Improvement Resilience  Sleep:  Number of Hours: 6.75  Cognition: WNL  ADL's:  Intact   Mental Status Per Nursing Assessment::   On  Admission:  Self-harm thoughts  Demographic Factors:  5, lives with fiance, has 6 children, employed   Loss Factors: Relationship stressors, increased alcohol consumption  Historical Factors: No prior psychiatric admissions, denies prior suicide attempts, reports history of increased frequency /amout of alcohol consumption  Risk Reduction Factors:   Responsible for children under 58 years of age, Sense of responsibility to family and Positive coping skills or problem solving skills  Continued Clinical Symptoms:  At this time patient is alert, attentive, well related, calm, pleasant on approach. Describes mood as improved and presents euthymic, with a full range of affect, no thought disorder, no suicidal or self injurious ideations, no homicidal or violent ideations, no hallucinations, no delusions. Denies medication side effects. Side effect profiles reviewed.  No current symptoms of alcohol WDL. No tremors, no diaphoresis, no restlessness or agitation. Behavior on unit calm and in good control, pleasant on approach. Patient had been taking Wellbutrin XL and Buspar prior to admission. Of note, reports was actually taking Wellbutrin XL at 150 mgrs QDAY ( not at 450 mgrs ). States these medications were helping and well tolerated . Wellbutrin XL had been held on admission due to potential increased seizure risk in the context of alcohol WDL. Will resume Wellbutrin XL 150 mgrs QAM and Buspar 5 mgrs BID .   Cognitive Features That Contribute To Risk:  No gross cognitive deficits noted upon discharge. Is alert , attentive, and oriented x 3   Suicide Risk:  Mild:  Suicidal ideation of limited frequency, intensity, duration, and specificity.  There are no identifiable plans, no associated intent, mild  dysphoria and related symptoms, good self-control (both objective and subjective assessment), few other risk factors, and identifiable protective factors, including available and accessible social  support.  Follow-up Information    [facility] Follow up.   Why: CSW left voicemail requesting a follow up appointment. Please contact the office to schedule a hospital follow up appointment within 3-5 days of discharge. If call is returned, CSW will call you with your appointment.  Contact information: [address] [place],  [place], [place] [address]  [phone] [phone]          Plan Of Care/Follow-up recommendations:  Activity:  as tolerated Diet:  Regular Tests:  NA Other:  See below  Patient is expressing readiness for discharge and there are no current grounds for involuntary commitment she is leaving unit in good spirits , plans to return home .  Plans to follow up as above , and also reports she is planning on attending AA groups .   Craige Cotta, MD 08/12/2019, 12:39 PM

## 2019-08-12 NOTE — Discharge Summary (Addendum)
Physician Discharge Summary Note  Patient:  Shelley Hines is an 53 y.o., female  MRN:  [id number]  DOB:  1965-11-20  Patient phone:  [phone] (home)   Patient address:   [place] [address],   Total Time spent with patient: Greater than 30 minutes  Date of Admission:  08/09/2019  Date of Discharge: 08-12-19  Reason for Admission: Suicide attempt by overdose after an argument with significant other.  Principal Problem: Alcohol dependence with alcohol-induced mood disorder [facility])  Discharge Diagnoses: Principal Problem:   Alcohol dependence with alcohol-induced mood disorder ([place]) Active Problems:   MDD (major depressive disorder), recurrent severe, without psychosis ([place])  Past Psychiatric History: Major depressive disorder, Alcohol use disorder  Past Medical History:  Past Medical History:  Diagnosis Date  . Depression   . Primary hyperthyroidism     Past Surgical History:  Procedure Laterality Date  . BRAIN SURGERY    . CESAREAN SECTION     Family History:  Family History  Problem Relation Age of Onset  . Thyroid disease Neg Hx    Family Psychiatric  History: See H&P  Social History:  Social History   Substance and Sexual Activity  Alcohol Use Yes   Comment: occasionally     Social History   Substance and Sexual Activity  Drug Use Yes  . Types: Marijuana    Social History   Socioeconomic History  . Marital status: Divorced    Spouse name: Not on file  . Number of children: Not on file  . Years of education: Not on file  . Highest education level: Not on file  Occupational History  . Not on file  Tobacco Use  . Smoking status: Never Smoker  . Smokeless tobacco: Never Used  Substance and Sexual Activity  . Alcohol use: Yes    Comment: occasionally  . Drug use: Yes    Types: Marijuana  . Sexual activity: Yes    Birth control/protection: None  Other Topics Concern  . Not on file  Social History Narrative  . Not on  file   Social Determinants of Health   Financial Resource Strain:   . Difficulty of Paying Living Expenses: Not on file  Food Insecurity:   . Worried About Charity fundraiser in the Last Year: Not on file  . Ran Out of Food in the Last Year: Not on file  Transportation Needs:   . Lack of Transportation (Medical): Not on file  . Lack of Transportation (Non-Medical): Not on file  Physical Activity:   . Days of Exercise per Week: Not on file  . Minutes of Exercise per Session: Not on file  Stress:   . Feeling of Stress : Not on file  Social Connections:   . Frequency of Communication with Friends and Family: Not on file  . Frequency of Social Gatherings with Friends and Family: Not on file  . Attends Religious Services: Not on file  . Active Member of Clubs or Organizations: Not on file  . Attends Archivist Meetings: Not on file  . Marital Status: Not on file   Hospital Course: (Per Md's admission evaluation): 52,  lives with fiance,  has 6 adult children (ranging in ages from 39 to 64), employed. Presented to ED with fiance, following an overdose on Celexa /NSAID (Ibuprofen)/Alcohol. States she took " a handful of medication", unsure of quantity. Describes overdose as impulsive and unplanned, triggered by argument with her fiance. After overdosing she states  she informed her SO and was brought to ED (of note, states she went to ED one day after overdose). She reports she has been depressed recently, which she attributes in part to relationship stressors, but denies having had any suicidal or self injurious ideations leading up to above.  Markeda was admitted to the [facility] adult unit for crisis management due to suicide attempt by overdose after an argument with her significant other. After this event, she was brought to the ED for evaluation & treatments.   After evaluation of her presenting symptoms, Pearson was started on the medication regimen for her presenting symptoms. Their  indications & side effects were explained to her. She received, stabilized & was discharged on the medications as listed below on her discharge medication lists. She was enrolled & participated in the group counseling sessions being offered & held on this unit. She learned coping skills.   During the course of her hosptalization, Nachelle's improvement was monitored by observation & her daily report of symptom reduction noted.  Her emotional & mental status were monitored by daily self-inventory reports completed by her & the clinical staff. She reported continued improvement on daily basis & denied any new concerns. She was enrolled & encouraged to attend group seesions to help with recognizing triggers of her emotional crises & ways to cope better with them.         Penni was evaluated by the treatment team on daily basis for mood stability & plans for continued recovery after discharge. She was offered further treatment options upon discharge on an outpatient basis as noted below. She was encouraged to maintain satisfactory support network & home environment as this will aid in maintaining mood stability. She was instructed & encouraged to adhere to her medication regimen as recommended by her treatment team.    Lauranne was seen this morning by the attending psychiatrist for her discharge evaluation. Her reason for admission, treatment plans & response to treatment discussed. She endorsed that she is doing well & ready to be discharged to continue mental health care on an outpatient basis as noted below. She was provided with all the necessary information needed to make this appointment without problems. Upon discharge, Kacie was both mentally & medically stable denying suicidal/homicidal ideation, auditory/visual/tactile hallucinations, delusional thoughts and paranoia. She was able to engage in safety planning including plan to return to [facility] or contact emergency services if she feels unable to maintain her  own safety or the safety of others. Pt had no further questions, comments, or concerns. She left [facility] with all personal belongings in no apparent distress. Transportation per friend.  Physical Findings: AIMS: Facial and Oral Movements Muscles of Facial Expression: None, normal Lips and Perioral Area: None, normal Jaw: None, normal Tongue: None, normal,Extremity Movements Upper (arms, wrists, hands, fingers): None, normal Lower (legs, knees, ankles, toes): None, normal, Trunk Movements Neck, shoulders, hips: None, normal, Overall Severity Severity of abnormal movements (highest score from questions above): None, normal Incapacitation due to abnormal movements: None, normal Patient's awareness of abnormal movements (rate only patient's report): No Awareness, Dental Status Current problems with teeth and/or dentures?: No Does patient usually wear dentures?: No  CIWA:  CIWA-Ar Total: 0 COWS:  COWS Total Score: 1  Musculoskeletal: Strength & Muscle Tone: within normal limits Gait & Station: normal Patient leans: N/A  Psychiatric Specialty Exam: Physical Exam  Constitutional: She is oriented to person, place, and time. She appears well-developed.  Cardiovascular: Normal rate.  Respiratory: Effort normal.  Genitourinary:    Genitourinary Comments: Deferred   Musculoskeletal:        General: Normal range of motion.     Cervical back: Normal range of motion.  Neurological: She is alert and oriented to person, place, and time.  Skin: Skin is warm.    Review of Systems  Constitutional: Negative for chills, fatigue and fever.  HENT: Negative for congestion and sneezing.   Respiratory: Negative for cough, chest tightness, shortness of breath and wheezing.   Cardiovascular: Negative for chest pain and palpitations.  Gastrointestinal: Negative for diarrhea, nausea and vomiting.  Genitourinary: Negative for flank pain.  Musculoskeletal: Negative for arthralgias.  Neurological: Negative  for tremors, numbness and headaches.  Psychiatric/Behavioral: Negative for agitation, behavioral problems, dysphoric mood, hallucinations and suicidal ideas. The patient is not nervous/anxious.     Blood pressure (!) 110/48, pulse 73, temperature 98.2 F (36.8 C), resp. rate 16, height 5\' 7"  (1.702 m), weight 64.9 kg, SpO2 100 %.Body mass index is 22.4 kg/m.  See Md's discharge SRA   Have you used any form of tobacco in the last 30 days? (Cigarettes, Smokeless Tobacco, Cigars, and/or Pipes): No  Has this patient used any form of tobacco in the last 30 days? (Cigarettes, Smokeless Tobacco, Cigars, and/or Pipes): N/A  Blood Alcohol level:  Lab Results  Component Value Date   ETH <10 08/08/2019   Metabolic Disorder Labs:  No results found for: HGBA1C, MPG No results found for: PROLACTIN No results found for: CHOL, TRIG, HDL, CHOLHDL, VLDL, LDLCALC  See Psychiatric Specialty Exam and Suicide Risk Assessment completed by Attending Physician prior to discharge.  Discharge destination:  Home  Is patient on multiple antipsychotic therapies at discharge:  No   Has Patient had three or more failed trials of antipsychotic monotherapy by history:  No  Recommended Plan for Multiple Antipsychotic Therapies: NA  Allergies as of 08/12/2019      Reactions   Codeine Hives, Itching, Swelling   Fentanyl Citrate Itching   Hydrocodone-acetaminophen Hives, Itching, Swelling   Oxycodone-acetaminophen Hives, Itching, Nausea And Vomiting, Swelling   Penicillins Hives, Itching, Swelling   Tramadol Hives, Itching, Swelling   Tramadol-acetaminophen Hives, Itching, Swelling      Medication List    STOP taking these medications   Ginseng 100 MG Caps   methimazole 10 MG tablet Commonly known as: TAPAZOLE   polyethylene glycol powder 17 GM/SCOOP powder Commonly known as: GLYCOLAX/MIRALAX   Vitamin D-3 125 MCG (5000 UT) Tabs     TAKE these medications     Indication  acamprosate 333 MG  tablet Commonly known as: CAMPRAL Take 2 tablets (666 mg total) by mouth 3 (three) times daily with meals. For alcoholism  Indication: Abuse or Misuse of Alcohol   buPROPion 150 MG 24 hr tablet Commonly known as: WELLBUTRIN XL Take 1 tablet (150 mg total) by mouth daily. For depression What changed:   how much to take  additional instructions  Indication: Major Depressive Disorder   busPIRone 5 MG tablet Commonly known as: BUSPAR Take 1 tablet (5 mg total) by mouth 2 (two) times daily. For anxiety What changed: additional instructions  Indication: Anxiety Disorder   hydrOXYzine 25 MG tablet Commonly known as: ATARAX/VISTARIL Take 1 tablet (25 mg total) by mouth every 6 (six) hours as needed. For anxiety  Indication: Feeling Anxious   traZODone 50 MG tablet Commonly known as: DESYREL Take 1 tablet (50 mg total) by mouth at bedtime as needed for sleep.  Indication: Trouble Sleeping  Follow-up Information    [facility] Follow up.   Why: CSW left voicemail requesting a follow up appointment. Please contact the office to schedule a hospital follow up appointment within 3-5 days of discharge. If call is returned, CSW will call you with your appointment.  Contact information: [address] [place],  [place], [place] [address]  [phone] [phone]         Follow-up recommendations: Activity:  As tolerated Diet: As recommended by your primary care doctor. Keep all scheduled follow-up appointments as recommended.   Comments: Prescriptions given at discharge.  Patient agreeable to plan.  Given opportunity to ask questions.  Appears to feel comfortable with discharge denies any current suicidal or homicidal thought. Patient is also instructed prior to discharge to: Take all medications as prescribed by his/her mental healthcare provider. Report any adverse effects and or reactions from the medicines to his/her outpatient provider promptly. Patient has been  instructed & cautioned: To not engage in alcohol and or illegal drug use while on prescription medicines. In the event of worsening symptoms, patient is instructed to call the crisis hotline, 911 and or go to the nearest ED for appropriate evaluation and treatment of symptoms. To follow-up with his/her primary care provider for your other medical issues, concerns and or health care needs.  Signed: Armandina Stammer, NP, PMHNP, FNP-BC 08/12/2019, 9:59 AM   Patient seen, Suicide Assessment Completed.  Disposition Plan Reviewed

## 2019-08-12 NOTE — Progress Notes (Signed)
  [facility] Adult Case Management Discharge Plan :  Will you be returning to the same living situation after discharge:  Yes,  patient is returning home with her fiance At discharge, do you have transportation home?: Yes,  patient's close friend will pick her up Do you have the ability to pay for your medications: Yes,  [employer]  Release of information consent forms completed and in the chart;  Patient's signature needed at discharge.  Patient to Follow up at: Follow-up Information    [facility] Follow up.   Why: CSW left voicemail requesting a follow up appointment. Please contact the office to schedule a hospital follow up appointment within 3-5 days of discharge. If call is returned, CSW will call you with your appointment.  Contact information: [address]  [place], [place] [address]  [phone] [phone]          Next level of care provider has access to [place] and Suicide Prevention discussed: Yes,  with the patient  Have you used any form of tobacco in the last 30 days? (Cigarettes, Smokeless Tobacco, Cigars, and/or Pipes): No  Has patient been referred to the Quitline?: N/A patient is not a smoker  Patient has been referred for addiction treatment: N/A  Marylee Floras, [place] 08/12/2019, 10:22 AM

## 2019-08-12 NOTE — Progress Notes (Signed)
   08/11/19 2335  Psych Admission Type (Psych Patients Only)  Admission Status Voluntary  Psychosocial Assessment  Patient Complaints None  Eye Contact Brief  Facial Expression Anxious  Affect Anxious  Speech Logical/coherent  Interaction Assertive  Motor Activity Slow  Appearance/Hygiene In scrubs  Behavior Characteristics Cooperative  Mood Pleasant  Thought Process  Coherency WDL  Content WDL  Delusions None reported or observed  Perception WDL  Hallucination None reported or observed  Judgment Impaired  Confusion None  Danger to Self  Current suicidal ideation? Denies  Danger to Others  Danger to Others None reported or observed

## 2019-08-12 NOTE — Progress Notes (Signed)
Pt discharged to lobby. Pt was stable and appreciative at that time. All papers and prescriptions were given and valuables returned. Verbal understanding expressed. Denies SI/HI and A/VH. Pt given opportunity to express concerns and ask questions.  

## 2019-08-23 DIAGNOSIS — Z6823 Body mass index (BMI) 23.0-23.9, adult: Secondary | ICD-10-CM | POA: Diagnosis not present

## 2019-08-23 DIAGNOSIS — F332 Major depressive disorder, recurrent severe without psychotic features: Secondary | ICD-10-CM | POA: Diagnosis not present

## 2019-09-15 ENCOUNTER — Ambulatory Visit
Admission: RE | Admit: 2019-09-15 | Discharge: 2019-09-15 | Disposition: A | Discharge status: Home / Self Care | Payer: BC Managed Care – PPO | Source: Ambulatory Visit | Attending: Physician Assistant | Admitting: Physician Assistant

## 2019-09-15 ENCOUNTER — Other Ambulatory Visit: Payer: Self-pay

## 2019-09-15 DIAGNOSIS — Z1231 Encounter for screening mammogram for malignant neoplasm of breast: Secondary | ICD-10-CM

## 2019-10-05 DIAGNOSIS — R519 Headache, unspecified: Secondary | ICD-10-CM | POA: Diagnosis not present

## 2019-10-05 DIAGNOSIS — R5383 Other fatigue: Secondary | ICD-10-CM | POA: Diagnosis not present

## 2019-10-05 DIAGNOSIS — Z20828 Contact with and (suspected) exposure to other viral communicable diseases: Secondary | ICD-10-CM | POA: Diagnosis not present

## 2019-10-12 DIAGNOSIS — K5900 Constipation, unspecified: Secondary | ICD-10-CM | POA: Diagnosis not present

## 2019-10-14 ENCOUNTER — Emergency Department (HOSPITAL_COMMUNITY): Payer: BC Managed Care – PPO

## 2019-10-14 ENCOUNTER — Inpatient Hospital Stay (HOSPITAL_COMMUNITY)
Admission: EM | Admit: 2019-10-14 | Discharge: 2019-10-18 | DRG: 913 | Disposition: A | Discharge status: Home / Self Care | Payer: BC Managed Care – PPO | Attending: Internal Medicine | Admitting: Internal Medicine

## 2019-10-14 ENCOUNTER — Other Ambulatory Visit: Payer: Self-pay

## 2019-10-14 ENCOUNTER — Encounter (HOSPITAL_COMMUNITY): Payer: Self-pay

## 2019-10-14 DIAGNOSIS — E0590 Thyrotoxicosis, unspecified without thyrotoxic crisis or storm: Secondary | ICD-10-CM | POA: Diagnosis not present

## 2019-10-14 DIAGNOSIS — T80818A Extravasation of other vesicant agent, initial encounter: Secondary | ICD-10-CM | POA: Diagnosis not present

## 2019-10-14 DIAGNOSIS — Z03818 Encounter for observation for suspected exposure to other biological agents ruled out: Secondary | ICD-10-CM | POA: Diagnosis not present

## 2019-10-14 DIAGNOSIS — K623 Rectal prolapse: Secondary | ICD-10-CM | POA: Diagnosis present

## 2019-10-14 DIAGNOSIS — I714 Abdominal aortic aneurysm, without rupture: Secondary | ICD-10-CM | POA: Diagnosis not present

## 2019-10-14 DIAGNOSIS — F419 Anxiety disorder, unspecified: Secondary | ICD-10-CM | POA: Diagnosis present

## 2019-10-14 DIAGNOSIS — F1020 Alcohol dependence, uncomplicated: Secondary | ICD-10-CM | POA: Diagnosis not present

## 2019-10-14 DIAGNOSIS — K683 Retroperitoneal hematoma: Secondary | ICD-10-CM | POA: Diagnosis present

## 2019-10-14 DIAGNOSIS — D72829 Elevated white blood cell count, unspecified: Secondary | ICD-10-CM | POA: Diagnosis not present

## 2019-10-14 DIAGNOSIS — R064 Hyperventilation: Secondary | ICD-10-CM | POA: Diagnosis not present

## 2019-10-14 DIAGNOSIS — J9811 Atelectasis: Secondary | ICD-10-CM | POA: Diagnosis not present

## 2019-10-14 DIAGNOSIS — Z20822 Contact with and (suspected) exposure to covid-19: Secondary | ICD-10-CM | POA: Diagnosis not present

## 2019-10-14 DIAGNOSIS — K661 Hemoperitoneum: Secondary | ICD-10-CM | POA: Diagnosis present

## 2019-10-14 DIAGNOSIS — T508X5A Adverse effect of diagnostic agents, initial encounter: Secondary | ICD-10-CM | POA: Diagnosis not present

## 2019-10-14 DIAGNOSIS — R066 Hiccough: Secondary | ICD-10-CM | POA: Diagnosis present

## 2019-10-14 DIAGNOSIS — Z885 Allergy status to narcotic agent status: Secondary | ICD-10-CM

## 2019-10-14 DIAGNOSIS — R030 Elevated blood-pressure reading, without diagnosis of hypertension: Secondary | ICD-10-CM | POA: Diagnosis present

## 2019-10-14 DIAGNOSIS — E876 Hypokalemia: Secondary | ICD-10-CM | POA: Diagnosis present

## 2019-10-14 DIAGNOSIS — R1084 Generalized abdominal pain: Secondary | ICD-10-CM | POA: Diagnosis not present

## 2019-10-14 DIAGNOSIS — S3501XA Minor laceration of abdominal aorta, initial encounter: Secondary | ICD-10-CM | POA: Diagnosis not present

## 2019-10-14 DIAGNOSIS — I7102 Dissection of abdominal aorta: Secondary | ICD-10-CM | POA: Diagnosis not present

## 2019-10-14 DIAGNOSIS — E039 Hypothyroidism, unspecified: Secondary | ICD-10-CM | POA: Diagnosis present

## 2019-10-14 DIAGNOSIS — R918 Other nonspecific abnormal finding of lung field: Secondary | ICD-10-CM | POA: Diagnosis present

## 2019-10-14 DIAGNOSIS — Z888 Allergy status to other drugs, medicaments and biological substances status: Secondary | ICD-10-CM

## 2019-10-14 DIAGNOSIS — F329 Major depressive disorder, single episode, unspecified: Secondary | ICD-10-CM | POA: Diagnosis present

## 2019-10-14 DIAGNOSIS — R52 Pain, unspecified: Secondary | ICD-10-CM | POA: Diagnosis not present

## 2019-10-14 DIAGNOSIS — K5909 Other constipation: Secondary | ICD-10-CM | POA: Diagnosis not present

## 2019-10-14 DIAGNOSIS — D62 Acute posthemorrhagic anemia: Secondary | ICD-10-CM | POA: Diagnosis not present

## 2019-10-14 DIAGNOSIS — R58 Hemorrhage, not elsewhere classified: Secondary | ICD-10-CM | POA: Diagnosis not present

## 2019-10-14 DIAGNOSIS — Z880 Allergy status to penicillin: Secondary | ICD-10-CM

## 2019-10-14 DIAGNOSIS — F332 Major depressive disorder, recurrent severe without psychotic features: Secondary | ICD-10-CM | POA: Diagnosis not present

## 2019-10-14 DIAGNOSIS — R109 Unspecified abdominal pain: Secondary | ICD-10-CM | POA: Diagnosis not present

## 2019-10-14 DIAGNOSIS — T68XXXA Hypothermia, initial encounter: Secondary | ICD-10-CM | POA: Diagnosis not present

## 2019-10-14 LAB — PREPARE RBC (CROSSMATCH)

## 2019-10-14 LAB — CBC WITH DIFFERENTIAL/PLATELET
Abs Immature Granulocytes: 0.08 10*3/uL — ABNORMAL HIGH (ref 0.00–0.07)
Basophils Absolute: 0 10*3/uL (ref 0.0–0.1)
Basophils Relative: 0 %
Eosinophils Absolute: 0 10*3/uL (ref 0.0–0.5)
Eosinophils Relative: 0 %
HCT: 24.6 % — ABNORMAL LOW (ref 36.0–46.0)
Hemoglobin: 8.2 g/dL — ABNORMAL LOW (ref 12.0–15.0)
Immature Granulocytes: 1 %
Lymphocytes Relative: 6 %
Lymphs Abs: 0.7 10*3/uL (ref 0.7–4.0)
MCH: 30.7 pg (ref 26.0–34.0)
MCHC: 33.3 g/dL (ref 30.0–36.0)
MCV: 92.1 fL (ref 80.0–100.0)
Monocytes Absolute: 0.4 10*3/uL (ref 0.1–1.0)
Monocytes Relative: 3 %
Neutro Abs: 11.3 10*3/uL — ABNORMAL HIGH (ref 1.7–7.7)
Neutrophils Relative %: 90 %
Platelets: 312 10*3/uL (ref 150–400)
RBC: 2.67 MIL/uL — ABNORMAL LOW (ref 3.87–5.11)
RDW: 12.8 % (ref 11.5–15.5)
WBC: 12.5 10*3/uL — ABNORMAL HIGH (ref 4.0–10.5)
nRBC: 0 % (ref 0.0–0.2)

## 2019-10-14 LAB — COMPREHENSIVE METABOLIC PANEL
ALT: 19 U/L (ref 0–44)
AST: 18 U/L (ref 15–41)
Albumin: 3.2 g/dL — ABNORMAL LOW (ref 3.5–5.0)
Alkaline Phosphatase: 73 U/L (ref 38–126)
Anion gap: 7 (ref 5–15)
BUN: 11 mg/dL (ref 6–20)
CO2: 26 mmol/L (ref 22–32)
Calcium: 8 mg/dL — ABNORMAL LOW (ref 8.9–10.3)
Chloride: 100 mmol/L (ref 98–111)
Creatinine, Ser: 0.73 mg/dL (ref 0.44–1.00)
GFR calc Af Amer: >60 mL/min (ref >60)
GFR calc non Af Amer: >60 mL/min (ref >60)
Glucose, Bld: 140 mg/dL — ABNORMAL HIGH (ref 70–99)
Potassium: 3.1 mmol/L — ABNORMAL LOW (ref 3.5–5.1)
Sodium: 133 mmol/L — ABNORMAL LOW (ref 135–145)
Total Bilirubin: 1 mg/dL (ref 0.3–1.2)
Total Protein: 6.2 g/dL — ABNORMAL LOW (ref 6.5–8.1)

## 2019-10-14 LAB — URINALYSIS, ROUTINE W REFLEX MICROSCOPIC
Bacteria, UA: NONE SEEN
Bilirubin Urine: NEGATIVE
Glucose, UA: NEGATIVE mg/dL
Hgb urine dipstick: NEGATIVE
Ketones, ur: 80 mg/dL — AB
Leukocytes,Ua: NEGATIVE
Nitrite: NEGATIVE
Protein, ur: 30 mg/dL — AB
Specific Gravity, Urine: 1.015 (ref 1.005–1.030)
pH: 9 — ABNORMAL HIGH (ref 5.0–8.0)

## 2019-10-14 LAB — ABO/RH: ABO/RH(D): A POS

## 2019-10-14 LAB — RESPIRATORY PANEL BY RT PCR (FLU A&B, COVID)
Influenza A by PCR: NEGATIVE
Influenza B by PCR: NEGATIVE
SARS Coronavirus 2 by RT PCR: NEGATIVE

## 2019-10-14 LAB — LIPASE, BLOOD: Lipase: 23 U/L (ref 11–51)

## 2019-10-14 MED ORDER — SODIUM CHLORIDE 0.9 % IV SOLN
10.0000 mL/h | Freq: Once | INTRAVENOUS | Status: AC
  Administered 2019-10-14: 10 mL/h via INTRAVENOUS

## 2019-10-14 MED ORDER — ADULT MULTIVITAMIN W/MINERALS CH
1.0000 | ORAL_TABLET | Freq: Every day | ORAL | Status: DC
  Administered 2019-10-15 – 2019-10-18 (×4): 1 via ORAL
  Filled 2019-10-14 (×4): qty 1

## 2019-10-14 MED ORDER — THIAMINE HCL 100 MG/ML IJ SOLN: 100.0000 mg | Freq: Every day | INTRAMUSCULAR | Status: DC

## 2019-10-14 MED ORDER — IOHEXOL 300 MG/ML  SOLN
100.0000 mL | Freq: Once | INTRAMUSCULAR | Status: AC | PRN
  Administered 2019-10-14: 100 mL via INTRAVENOUS

## 2019-10-14 MED ORDER — ONDANSETRON HCL 4 MG PO TABS: 4.0000 mg | ORAL_TABLET | Freq: Four times a day (QID) | ORAL | Status: DC | PRN

## 2019-10-14 MED ORDER — LORAZEPAM 2 MG/ML IJ SOLN: 1.0000 mg | INTRAMUSCULAR | Status: DC | PRN

## 2019-10-14 MED ORDER — HYDROMORPHONE HCL 1 MG/ML IJ SOLN
0.5000 mg | INTRAMUSCULAR | Status: DC | PRN
  Administered 2019-10-14 – 2019-10-15 (×4): 0.5 mg via INTRAVENOUS
  Filled 2019-10-14 (×4): qty 1

## 2019-10-14 MED ORDER — BUPROPION HCL ER (XL) 300 MG PO TB24
450.0000 mg | ORAL_TABLET | Freq: Every day | ORAL | Status: DC
  Administered 2019-10-15 – 2019-10-18 (×4): 450 mg via ORAL
  Filled 2019-10-14 (×4): qty 1

## 2019-10-14 MED ORDER — HYDROMORPHONE HCL 1 MG/ML IJ SOLN
1.0000 mg | Freq: Once | INTRAMUSCULAR | Status: AC
  Administered 2019-10-14: 1 mg via INTRAVENOUS
  Filled 2019-10-14: qty 1

## 2019-10-14 MED ORDER — LORAZEPAM 1 MG PO TABS: 1.0000 mg | ORAL_TABLET | ORAL | Status: DC | PRN

## 2019-10-14 MED ORDER — SODIUM CHLORIDE 0.9 % IV BOLUS
1000.0000 mL | Freq: Once | INTRAVENOUS | Status: AC
  Administered 2019-10-14: 1000 mL via INTRAVENOUS

## 2019-10-14 MED ORDER — LABETALOL HCL 5 MG/ML IV SOLN: 10.0000 mg | INTRAVENOUS | Status: DC | PRN

## 2019-10-14 MED ORDER — FOLIC ACID 1 MG PO TABS
1.0000 mg | ORAL_TABLET | Freq: Every day | ORAL | Status: DC
  Administered 2019-10-15 – 2019-10-18 (×4): 1 mg via ORAL
  Filled 2019-10-14 (×4): qty 1

## 2019-10-14 MED ORDER — THIAMINE HCL 100 MG PO TABS
100.0000 mg | ORAL_TABLET | Freq: Every day | ORAL | Status: DC
  Administered 2019-10-15 – 2019-10-18 (×4): 100 mg via ORAL
  Filled 2019-10-14 (×4): qty 1

## 2019-10-14 MED ORDER — POTASSIUM CHLORIDE CRYS ER 20 MEQ PO TBCR
40.0000 meq | EXTENDED_RELEASE_TABLET | Freq: Once | ORAL | Status: AC
  Administered 2019-10-14: 40 meq via ORAL
  Filled 2019-10-14: qty 2

## 2019-10-14 MED ORDER — ONDANSETRON HCL 4 MG/2ML IJ SOLN: 4.0000 mg | Freq: Four times a day (QID) | INTRAMUSCULAR | Status: DC | PRN

## 2019-10-14 MED ORDER — POLYETHYLENE GLYCOL 3350 17 G PO PACK: 17.0000 g | PACK | Freq: Every day | ORAL | Status: DC | PRN

## 2019-10-14 NOTE — H&P (Addendum)
History and Physical    Shelley Hines PYP:[id number] DOB: 1966-07-22 DOA: 10/14/2019  PCP: Lovey Newcomer, PA   Patient coming from: Home  I have personally briefly reviewed patient's old medical records in [facility]  Chief Complaint: Abdominal Pain  HPI: Shelley Hines is a 54 y.o. female with medical history significant for alcohol dependence, depression, hyper thyroidism.  Patient presented to the ED with complaints of abdominal pain the started about 1 week ago.  She reports dizziness when she gets up . She denies vomiting, no loose stools.  No fever no chills.  No chest pain.  No difficulty breathing.  ED Course: Tachypneic to 34, blood pressure systolic 150s to 245Y O2 sats greater than 98% on room air. Hemoglobin 8.2 down from 13  2 months ago.  Potassium 3.1.  Abdominal pelvic CT with contrast showed a large left retroperitoneal hemorrhage, contrast extravasation appears to be contiguous with the left ovarian vein and raises concern for rupture or avulsion of the left ovarian vein. EDP talked to her in Dr. Emelda Fear (please see his note), who discussed case with Dr. Janee Morn trauma surgeon on-call-retroperitoneal hematoma and neurosurgical intervention and can on occasion require IR involvement, but radiology already stated that this is unlikely to be amenable to IR assistance. Recommended transfusion, admit to main campus or [facility] pending hospitalist decision.  Review of Systems: As per HPI all other systems reviewed and negative.  Past Medical History:  Diagnosis Date  . Depression   . Primary hyperthyroidism     Past Surgical History:  Procedure Laterality Date  . BRAIN SURGERY    . CESAREAN SECTION       reports that she has never smoked. She has never used smokeless tobacco. She reports previous alcohol use. She reports previous drug use. Drug: Marijuana.  Allergies  Allergen Reactions  . Codeine Hives, Itching and Swelling  . Fentanyl Citrate Itching   . Hydrocodone-Acetaminophen Hives, Itching and Swelling  . Oxycodone-Acetaminophen Hives, Itching, Nausea And Vomiting and Swelling  . Penicillins Hives, Itching and Swelling  . Tramadol Hives, Itching and Swelling    Family History  Problem Relation Age of Onset  . Thyroid disease Neg Hx     Prior to Admission medications   Medication Sig Start Date End Date Taking? Authorizing Provider  buPROPion (WELLBUTRIN XL) 150 MG 24 hr tablet Take 1 tablet (150 mg total) by mouth daily. For depression Patient taking differently: Take 450 mg by mouth daily. For depression 08/12/19  Yes Nwoko, Nicole Kindred I, NP  busPIRone (BUSPAR) 5 MG tablet Take 1 tablet (5 mg total) by mouth 2 (two) times daily. For anxiety 08/12/19  Yes Armandina Stammer I, NP  cefPROZIL (CEFZIL) 250 MG tablet Take 250 mg by mouth 2 (two) times daily. 10 day course prescribed on 10/06/2019 10/06/19 10/16/19 Yes [provider]  Multiple Vitamin (MULTIVITAMIN WITH MINERALS) TABS tablet Take 1 tablet by mouth daily.   Yes [provider]  psyllium (METAMUCIL) 58.6 % powder Take 1 packet by mouth once as needed (for constipation).   Yes [provider]  acamprosate (CAMPRAL) 333 MG tablet Take 2 tablets (666 mg total) by mouth 3 (three) times daily with meals. For alcoholism Patient not taking: Reported on 10/14/2019 08/12/19   Armandina Stammer I, NP  hydrOXYzine (ATARAX/VISTARIL) 25 MG tablet Take 1 tablet (25 mg total) by mouth every 6 (six) hours as needed. For anxiety Patient not taking: Reported on 10/14/2019 08/12/19   Sanjuana Kava, NP  traZODone (DESYREL) 50 MG tablet Take 1 tablet (50 mg total) by mouth at bedtime as needed for sleep. Patient not taking: Reported on 10/14/2019 08/12/19   Armandina Stammer I, NP    Physical Exam: Vitals:   10/14/19 1630 10/14/19 1930 10/14/19 2000 10/14/19 2030  BP: (!) 179/91  (!) 161/87 (!) 155/86  Pulse: 81 69 84 76  Resp: 19 17 17 19   Temp:   98.7 F (37.1 C) 98.7 F (37.1 C)   TempSrc:   Oral Oral  SpO2: 98% 99% 100% 97%  Weight:      Height:        Constitutional: NAD, calm, comfortable Vitals:   10/14/19 1630 10/14/19 1930 10/14/19 2000 10/14/19 2030  BP: (!) 179/91  (!) 161/87 (!) 155/86  Pulse: 81 69 84 76  Resp: 19 17 17 19   Temp:   98.7 F (37.1 C) 98.7 F (37.1 C)  TempSrc:   Oral Oral  SpO2: 98% 99% 100% 97%  Weight:      Height:       Eyes: PERRL, lids and conjunctivae normal ENMT: Mucous membranes are moist. Posterior pharynx clear of any exudate or lesions. Neck: normal, supple, no masses, no thyromegaly Respiratory:  Normal respiratory effort. No accessory muscle use.  Cardiovascular: Regular rate and rhythm, No extremity edema. 2+ pedal pulses.  Abdomen: no tenderness, not distended, no masses palpated. No hepatosplenomegaly. Bowel sounds positive.  Musculoskeletal: no clubbing / cyanosis. No joint deformity upper and lower extremities. Good ROM, no contractures. Normal muscle tone.  Skin: no rashes, lesions, ulcers. No induration Neurologic: No gross cranial nerve abnormality, moving extremities spontaneously. Psychiatric: Normal judgment and insight. Alert and oriented x 3. Normal mood.   Labs on Admission: I have personally reviewed following labs and imaging studies  CBC: Recent Labs  Lab 10/14/19 1341  WBC 12.5*  NEUTROABS 11.3*  HGB 8.2*  HCT 24.6*  MCV 92.1  PLT 312   Basic Metabolic Panel: Recent Labs  Lab 10/14/19 1341  NA 133*  K 3.1*  CL 100  CO2 26  GLUCOSE 140*  BUN 11  CREATININE 0.73  CALCIUM 8.0*   Liver Function Tests: Recent Labs  Lab 10/14/19 1341  AST 18  ALT 19  ALKPHOS 73  BILITOT 1.0  PROT 6.2*  ALBUMIN 3.2*   Recent Labs  Lab 10/14/19 1341  LIPASE 23    Urine analysis:    Component Value Date/Time   COLORURINE YELLOW 10/14/2019 1221   APPEARANCEUR CLEAR 10/14/2019 1221   LABSPEC 1.015 10/14/2019 1221   PHURINE 9.0 (H) 10/14/2019 1221   GLUCOSEU NEGATIVE 10/14/2019 1221     HGBUR NEGATIVE 10/14/2019 1221   BILIRUBINUR NEGATIVE 10/14/2019 1221   KETONESUR 80 (A) 10/14/2019 1221   PROTEINUR 30 (A) 10/14/2019 1221   NITRITE NEGATIVE 10/14/2019 1221   LEUKOCYTESUR NEGATIVE 10/14/2019 1221    Radiological Exams on Admission: CT Abdomen Pelvis W Contrast  Result Date: 10/14/2019 CLINICAL DATA:  54 year old with acute abdominal pain. EXAM: CT ABDOMEN AND PELVIS WITH CONTRAST TECHNIQUE: Multidetector CT imaging of the abdomen and pelvis was performed using the standard protocol following bolus administration of intravenous contrast. CONTRAST:  12/12/2019 OMNIPAQUE IOHEXOL 300 MG/ML  SOLN COMPARISON:  None. FINDINGS: Lower chest: No pleural effusions. Triangular pleural-based density at the left lung base on sequence 5 image 38 measures roughly 4 mm and indeterminate. Nodule along a left lung fissure on sequence 5, image 13 measuring 5 mm. Hepatobiliary: Multiple small hypodense structures in the  liver that are too small to definitively characterize. There is a 1.7 cm hypodensity in the left hepatic lobe that is suggestive for a cyst. Normal appearance of the gallbladder. Portal venous system is patent. No biliary dilatation. Pancreas: Unremarkable. No pancreatic ductal dilatation or surrounding inflammatory changes. Spleen: Normal in size without focal abnormality. Adrenals/Urinary Tract: Normal appearance of the adrenal gland. The left kidney is displaced anteriorly and has mild left hydronephrosis. The left kidney is displaced from a larger retroperitoneal hemorrhage. In addition there are wedge-shaped hypoechoic areas along the left kidney upper pole and concern for focal inflammation or infection. There is decreased attenuation along the superolateral aspect of the right kidney raising concern for edema or inflammation in this area. Negative for right hydronephrosis. 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 Normal appearance of the urinary  bladder. Stomach/Bowel: Moderate amount of stool in the colon. Duodenum and small bowel are displaced anteriorly from a large amount of retroperitoneal blood situated anterior to the aorta and inferior vena cava. Normal appearance of the stomach with a small hiatal hernia. No evidence for bowel obstruction. Vascular/Lymphatic: Normal caliber of the abdominal aorta without dissection. Celiac trunk, SMA, bilateral renal arteries and IMA are patent. Normal caliber of the iliac arteries without significant atherosclerotic disease or stenosis. There is a large left retroperitoneal hemorrhage with evidence for a pseudoaneurysm or focal contrast extravasation just left of the abdominal aorta on sequence 2, image 37. This area of contrast extravasation measures 2.8 x 2.1 x 3.1 cm. This area is just caudal to the left renal vein and the origin of the left ovarian vein. There is contrast within the left ovarian vein distal to this area of contrast extravasation. This area of contrast extravasation appears to be contiguous with the proximal left ovarian vein. Bilateral renal veins are patent. Suprarenal IVC is patent. Normal caliber of the iliac veins. Reproductive: Uterus and bilateral adnexa are unremarkable. Other: Large left retroperitoneal hemorrhage that measures roughly 18.5 x 10.7 x 10.5 cm. In addition, there is retroperitoneal blood crossing the midline below the SMA. There is blood posterior to the duodenum. Small amount of fluid or blood in the lower posterior abdomen. Negative for free air. Musculoskeletal: No acute bone abnormality. IMPRESSION: 1. Large left retroperitoneal hemorrhage. There is a focus of contrast extravasation and active bleeding just left of the abdominal aorta. This contrast extravasation appears to be contiguous with the left ovarian vein and raises concern for rupture or avulsion of the left ovarian vein. No clear evidence for an arterial bleeding source. 2. Displacement of the left kidney  with at least mild left hydronephrosis. Left kidney is displaced from the large retroperitoneal hemorrhage. 3. Areas of decreased attenuation involving upper pole of both kidneys, right side greater than left. Findings raise concern for areas of pyelonephritis. Cannot exclude some scarring particularly in the right kidney upper pole. 4. Small pulmonary nodules at the left lung base are indeterminate. Largest pulmonary nodule measures up to 5 mm. No follow-up needed if patient is low-risk. Non-contrast chest CT can be considered in 12 months if patient is high-risk. This recommendation follows the consensus statement: Guidelines for Management of Incidental Pulmonary Nodules Detected on CT Images: From the Fleischner Society 2017; Radiology 2017; 284:228-243. These results were called by telephone at the time of interpretation on 10/14/2019 at 3:31 pm to provider [facility] , who verbally acknowledged these results. Electronically Signed   By: Markus Daft M.D.   On: 10/14/2019 16:11  EKG: None.  Assessment/Plan Active Problems:   Retroperitoneal hemorrhage   Retroperitoneal hemorrhage-abdominal CT shows a large left retroperitoneal hemorrhage, concern for rupture of portion of the left ovarian vein.  No clear evidence for arterial bleeding source.  Also possible pyelonephritis, but UA is supportive of urinary tract infection. EDP talked to OB/GYN Dr. Emelda Fear who also talked to trauma surgeon-this is not a surgical case, recommended IR may be able to intervene. -Will admit to [facility] -Transfuse 1 unit PRBC -Goal hemoglobin > 8 -Monitor CBC closely -N.p.o. for now except for sips with meds -1 L bolus normal saline given, continue N/s + 40 KCL 100cc/hr x 15hrs -N.p.o. except for sips with meds -IV Dilaudid 0.5 mg as needed for pain -Care order instruction to stat page interventional radiology on arrival to [facility] -I  talked to interventional radiologist on-call, Dr. Loreta Ave will see  patient.  Hypokalemia - K 3.1. -  Replete - BMP a.m. - check magnesemium.   Alcohol Dependence- she tells me she has not drunk any alcohol in a long time, months.   -CIWA protocol as needed -Check magnesium, phosphorus -Thiamine folate multivitamins  Elevated blood pressure-systolic up to 190s, improved to 150s, no documented history of same, and not on antihypertensives. -PRN IV labetalol 10mg  for systolic > 150.  Depression-stable. -Resume home buspirone, bupropion, hydroxyzine, trazodone.   DVT prophylaxis: SCDs Code Status: Full code Family Communication: Patient requested that I call her significant other and give him an update.  I have talked to her significant other Shelley Hines, diagnosis, reason for transfer and plan of care explained, all questions answered. Disposition Plan: > 2 days Consults called: Interventional radiology Admission status: Inpatient, telemetry I certify that at the point of admission it is my clinical judgment that the patient will require inpatient hospital care spanning beyond 2 midnights from the point of admission due to high intensity of service, high risk for further deterioration and high frequency of surveillance required. The following factors support the patient status of inpatient: Active retroperitoneal hemorrhage.   Jaclynn Guarneri MD [facility]  10/14/2019, 9:49 PM

## 2019-10-14 NOTE — ED Notes (Signed)
Ashley Jacobs, RN reports blood bank called about 45 minutes ago to inform blood was ready; however unable to start due to "not having any pumps." Steward Drone, RN charge nurse made aware of need for pumps.

## 2019-10-14 NOTE — ED Provider Notes (Signed)
[facility] EMERGENCY DEPARTMENT Provider Note   CSN: [id number] Arrival date & time: 10/14/19  1112     History Chief Complaint  Patient presents with  . Abdominal Pain    Shelley Hines is a 54 y.o. female.  HPI Patient presents to the emergency department with abdominal discomfort that started 1 week ago.  The patient feels like she is got constipation and she saw her PCP on Tuesday for this.  Patient states that nothing seems to make her condition better or worse.  She states she has gotten some hard stool out over the last 24 hours.  Patient states that she took MiraLAX and Colace for her symptoms.  The patient denies chest pain, shortness of breath, headache,blurred vision, neck pain, fever, cough, weakness, numbness, dizziness, anorexia, edema,nausea, vomiting, diarrhea, rash, back pain, dysuria, hematemesis, bloody stool, near syncope, or syncope.  Past Medical History:  Diagnosis Date  . Depression   . Primary hyperthyroidism     Patient Active Problem List   Diagnosis Date Noted  . Alcohol dependence with alcohol-induced mood disorder (HCC)   . MDD (major depressive disorder), recurrent severe, without psychosis (HCC) 08/09/2019  . Thyrotoxicosis without mention of goiter or other cause, without mention of thyrotoxic crisis or storm 04/24/2014    Past Surgical History:  Procedure Laterality Date  . BRAIN SURGERY    . CESAREAN SECTION       OB History    Gravida  7   Para      Term      Preterm      AB      Living  6     SAB      TAB      Ectopic      Multiple      Live Births              Family History  Problem Relation Age of Onset  . Thyroid disease Neg Hx     Social History   Tobacco Use  . Smoking status: Never Smoker  . Smokeless tobacco: Never Used  Substance Use Topics  . Alcohol use: Not Currently    Comment: occasionally  . Drug use: Not Currently    Types: Marijuana    Home Medications Prior to Admission  medications   Medication Sig Start Date End Date Taking? Authorizing Provider  acamprosate (CAMPRAL) 333 MG tablet Take 2 tablets (666 mg total) by mouth 3 (three) times daily with meals. For alcoholism 08/12/19   Armandina Stammer I, NP  buPROPion (WELLBUTRIN XL) 150 MG 24 hr tablet Take 1 tablet (150 mg total) by mouth daily. For depression 08/12/19   Armandina Stammer I, NP  busPIRone (BUSPAR) 5 MG tablet Take 1 tablet (5 mg total) by mouth 2 (two) times daily. For anxiety 08/12/19   Armandina Stammer I, NP  cefPROZIL (CEFZIL) 250 MG tablet Take 250 mg by mouth 2 (two) times daily. 10/06/19 10/16/19  [provider]  hydrOXYzine (ATARAX/VISTARIL) 25 MG tablet Take 1 tablet (25 mg total) by mouth every 6 (six) hours as needed. For anxiety 08/12/19   Armandina Stammer I, NP  traZODone (DESYREL) 50 MG tablet Take 1 tablet (50 mg total) by mouth at bedtime as needed for sleep. 08/12/19   Armandina Stammer I, NP    Allergies    Codeine, Fentanyl citrate, Hydrocodone-acetaminophen, Oxycodone-acetaminophen, Penicillins, Tramadol, and Tramadol-acetaminophen  Review of Systems   Review of Systems All other systems negative except as documented in the  HPI. All pertinent positives and negatives as reviewed in the HPI. Physical Exam Updated Vital Signs BP (!) 159/81   Pulse 77   Temp 98.5 F (36.9 C) (Oral)   Resp (!) 29   Ht 5\' 7"  (1.702 m)   Wt 63.5 kg   SpO2 100%   BMI 21.93 kg/m   Physical Exam Vitals and nursing note reviewed.  Constitutional:      General: She is not in acute distress.    Appearance: She is well-developed.  HENT:     Head: Normocephalic and atraumatic.  Eyes:     Pupils: Pupils are equal, round, and reactive to light.  Cardiovascular:     Rate and Rhythm: Normal rate and regular rhythm.     Heart sounds: Normal heart sounds. No murmur. No friction rub. No gallop.   Pulmonary:     Effort: Pulmonary effort is normal. No respiratory distress.     Breath sounds: Normal breath  sounds. No wheezing.  Abdominal:     General: Bowel sounds are increased. There is no distension.     Palpations: Abdomen is soft.     Tenderness: There is generalized abdominal tenderness. There is guarding.  Musculoskeletal:     Cervical back: Normal range of motion and neck supple.  Skin:    General: Skin is warm and dry.     Capillary Refill: Capillary refill takes less than 2 seconds.     Findings: No erythema or rash.  Neurological:     Mental Status: She is alert and oriented to person, place, and time.     Motor: No abnormal muscle tone.     Coordination: Coordination normal.  Psychiatric:        Behavior: Behavior normal.     ED Results / Procedures / Treatments   Labs (all labs ordered are listed, but only abnormal results are displayed) Labs Reviewed  COMPREHENSIVE METABOLIC PANEL - Abnormal; Notable for the following components:      Result Value   Sodium 133 (*)    Potassium 3.1 (*)    Glucose, Bld 140 (*)    Calcium 8.0 (*)    Total Protein 6.2 (*)    Albumin 3.2 (*)    All other components within normal limits  CBC WITH DIFFERENTIAL/PLATELET - Abnormal; Notable for the following components:   WBC 12.5 (*)    RBC 2.67 (*)    Hemoglobin 8.2 (*)    HCT 24.6 (*)    Neutro Abs 11.3 (*)    Abs Immature Granulocytes 0.08 (*)    All other components within normal limits  URINALYSIS, ROUTINE W REFLEX MICROSCOPIC - Abnormal; Notable for the following components:   pH 9.0 (*)    Ketones, ur 80 (*)    Protein, ur 30 (*)    All other components within normal limits  LIPASE, BLOOD    EKG None  Radiology CT Abdomen Pelvis W Contrast  Result Date: 10/14/2019 CLINICAL DATA:  54 year old with acute abdominal pain. EXAM: CT ABDOMEN AND PELVIS WITH CONTRAST TECHNIQUE: Multidetector CT imaging of the abdomen and pelvis was performed using the standard protocol following bolus administration of intravenous contrast. CONTRAST:  57 OMNIPAQUE IOHEXOL 300 MG/ML  SOLN  COMPARISON:  None. FINDINGS: Lower chest: No pleural effusions. Triangular pleural-based density at the left lung base on sequence 5 image 38 measures roughly 4 mm and indeterminate. Nodule along a left lung fissure on sequence 5, image 13 measuring 5 mm. Hepatobiliary: Multiple small hypodense structures  in the liver that are too small to definitively characterize. There is a 1.7 cm hypodensity in the left hepatic lobe that is suggestive for a cyst. Normal appearance of the gallbladder. Portal venous system is patent. No biliary dilatation. Pancreas: Unremarkable. No pancreatic ductal dilatation or surrounding inflammatory changes. Spleen: Normal in size without focal abnormality. Adrenals/Urinary Tract: Normal appearance of the adrenal gland. The left kidney is displaced anteriorly and has mild left hydronephrosis. The left kidney is displaced from a larger retroperitoneal hemorrhage. In addition there are wedge-shaped hypoechoic areas along the left kidney upper pole and concern for focal inflammation or infection. There is decreased attenuation along the superolateral aspect of the right kidney raising concern for edema or inflammation in this area. Negative for right hydronephrosis. 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 Normal appearance of the urinary bladder. Stomach/Bowel: Moderate amount of stool in the colon. Duodenum and small bowel are displaced anteriorly from a large amount of retroperitoneal blood situated anterior to the aorta and inferior vena cava. Normal appearance of the stomach with a small hiatal hernia. No evidence for bowel obstruction. Vascular/Lymphatic: Normal caliber of the abdominal aorta without dissection. Celiac trunk, SMA, bilateral renal arteries and IMA are patent. Normal caliber of the iliac arteries without significant atherosclerotic disease or stenosis. There is a large left retroperitoneal hemorrhage with evidence for a  pseudoaneurysm or focal contrast extravasation just left of the abdominal aorta on sequence 2, image 37. This area of contrast extravasation measures 2.8 x 2.1 x 3.1 cm. This area is just caudal to the left renal vein and the origin of the left ovarian vein. There is contrast within the left ovarian vein distal to this area of contrast extravasation. This area of contrast extravasation appears to be contiguous with the proximal left ovarian vein. Bilateral renal veins are patent. Suprarenal IVC is patent. Normal caliber of the iliac veins. Reproductive: Uterus and bilateral adnexa are unremarkable. Other: Large left retroperitoneal hemorrhage that measures roughly 18.5 x 10.7 x 10.5 cm. In addition, there is retroperitoneal blood crossing the midline below the SMA. There is blood posterior to the duodenum. Small amount of fluid or blood in the lower posterior abdomen. Negative for free air. Musculoskeletal: No acute bone abnormality. IMPRESSION: 1. Large left retroperitoneal hemorrhage. There is a focus of contrast extravasation and active bleeding just left of the abdominal aorta. This contrast extravasation appears to be contiguous with the left ovarian vein and raises concern for rupture or avulsion of the left ovarian vein. No clear evidence for an arterial bleeding source. 2. Displacement of the left kidney with at least mild left hydronephrosis. Left kidney is displaced from the large retroperitoneal hemorrhage. 3. Areas of decreased attenuation involving upper pole of both kidneys, right side greater than left. Findings raise concern for areas of pyelonephritis. Cannot exclude some scarring particularly in the right kidney upper pole. 4. Small pulmonary nodules at the left lung base are indeterminate. Largest pulmonary nodule measures up to 5 mm. No follow-up needed if patient is low-risk. Non-contrast chest CT can be considered in 12 months if patient is high-risk. This recommendation follows the consensus  statement: Guidelines for Management of Incidental Pulmonary Nodules Detected on CT Images: From the Fleischner Society 2017; Radiology 2017; 284:228-243. These results were called by telephone at the time of interpretation on 10/14/2019 at 3:31 pm to provider [facility] , who verbally acknowledged these results. Electronically Signed   By: Markus Daft M.D.   On:  10/14/2019 16:11    Procedures Procedures (including critical care time)  Medications Ordered in ED Medications  sodium chloride 0.9 % bolus 1,000 mL (0 mLs Intravenous Stopped 10/14/19 1242)  iohexol (OMNIPAQUE) 300 MG/ML solution 100 mL (100 mLs Intravenous Contrast Given 10/14/19 1436)  HYDROmorphone (DILAUDID) injection 1 mg (1 mg Intravenous Given 10/14/19 1544)    ED Course  I have reviewed the triage vital signs and the nursing notes.  Pertinent labs & imaging results that were available during my care of the patient were reviewed by me and considered in my medical decision making (see chart for details).    MDM Rules/Calculators/A&P                      The radiologist called me and alerted me to the fact that the patient has a very large retroperitoneal hematoma that he feels is arising from the ovarian vein on the left.  Patient been stable thus far.  Will contact GYN initially.  We will formulate a plan on the best care for this patient.  She has been stable with her vital signs  Patient is given the plan and recheck.  I did talk to her about possibly giving her blood.  CRITICAL CARE Performed by: Jamesetta Orleans Charlottie Peragine Total critical care time: 30 minutes Critical care time was exclusive of separately billable procedures and treating other patients. Critical care was necessary to treat or prevent imminent or life-threatening deterioration. Critical care was time spent personally by me on the following activities: development of treatment plan with patient and/or surrogate as well as nursing, discussions with  consultants, evaluation of patient's response to treatment, examination of patient, obtaining history from patient or surrogate, ordering and performing treatments and interventions, ordering and review of laboratory studies, ordering and review of radiographic studies, pulse oximetry and re-evaluation of patient's condition.  Final Clinical Impression(s) / ED Diagnoses Final diagnoses:  None    Rx / DC Orders ED Discharge Orders    None       Charlestine Night, PA-C 10/14/19 1658    Mancel Bale, MD 10/16/19 1002

## 2019-10-14 NOTE — Plan of Care (Signed)
  Problem: Education: Goal: Knowledge of General Education information will improve Description Including pain rating scale, medication(s)/side effects and non-pharmacologic comfort measures Outcome: Progressing   

## 2019-10-14 NOTE — ED Provider Notes (Signed)
  Face-to-face evaluation   History: She presents for evaluation of abdominal pain present for 1 week, with decreased stooling, felt to be related to constipation.  Physical exam: Alert, calm.  She is uncomfortable.  No dysarthria or aphasia.  No respiratory distress.  She is lying in the left-sided fetal position secondary to pain.  Medical screening examination/treatment/procedure(s) were conducted as a shared visit with non-physician practitioner(s) and myself.  I personally evaluated the patient during the encounter    Mancel Bale, MD 10/16/19 1002

## 2019-10-14 NOTE — ED Notes (Signed)
Vital signs stable.ED Provider at bedside.

## 2019-10-14 NOTE — Progress Notes (Signed)
Patient arrive to [address] From [facility] via  [place]. Alert and oriented x4, Skin cold,clammy and pale. BP 113/60,HR 103,RR 14,T 97.8 and O2 sat 100 RA. Denies c/o pain at this time. Notified [facility] of pt arrival to floor. Admission orders noted.  Dr Ruthy Dick of IR was notified as well who will come see patient. Blood transfusion with RBC 1 unit infusing to left AC without problem. Oriented pt to room and remote.Wii continue to monitor.

## 2019-10-14 NOTE — ED Triage Notes (Signed)
Pt c/o upper abd pain approx 1 week ago.  Pt saw pcp on Tuesday and was told she was constipated and to take miralax and colace.  Pt  drank small amount of  Metamucil and has had only a small bm.  Reports feels like her rectum has prolapsed.

## 2019-10-14 NOTE — Consult Note (Signed)
I was consulted by phone on this 54 year old female, is being seen for abdominal pain and found to have a large left retroperitoneal hematoma that seems to be originating from the area of the left proximal ovarian veins and not involving the renal artery or vein.  There is some extravasation of contrast material into this suggesting ongoing extravasation.  There is no pelvic pathology otherwise noted. I discussed the case with Dr. Janee Morn, the on call trauma surgeon, who states that retroperitoneal hematomas are never a surgical intervention, and can on occasion require interventional radiology involvement, though radiology is already stated this 1 is unlikely to be amenable to IR assistance. Vital signs reviewed and recommendations of Dr.Thompson or that she be admitted to hospitalist service, for observation, probable transfusion as the hemoglobin dropped from 13-8.2 l may not represent complete equilibration, and follow serial hemoglobin.. We did not not discuss whether follow-up CT would be of value Blood pressure control certainly useful as she was quite hypertensive earlier.  Whether the patient is admitted here at [facility] or the hospitalist feel more comfortable with her being at the main campus is a hospitalist care decision This report has been given to Dr. Estell Harpin.  Thank you, Tilda Burrow [phone] cell

## 2019-10-15 ENCOUNTER — Encounter (HOSPITAL_COMMUNITY)
Admission: EM | Disposition: A | Discharge status: Home / Self Care | Payer: Self-pay | Source: Home / Self Care | Attending: Internal Medicine

## 2019-10-15 ENCOUNTER — Inpatient Hospital Stay (HOSPITAL_COMMUNITY): Payer: BC Managed Care – PPO

## 2019-10-15 ENCOUNTER — Inpatient Hospital Stay (HOSPITAL_COMMUNITY): Payer: BC Managed Care – PPO | Admitting: Anesthesiology

## 2019-10-15 ENCOUNTER — Other Ambulatory Visit: Payer: Self-pay

## 2019-10-15 DIAGNOSIS — R58 Hemorrhage, not elsewhere classified: Secondary | ICD-10-CM | POA: Diagnosis not present

## 2019-10-15 DIAGNOSIS — I714 Abdominal aortic aneurysm, without rupture: Secondary | ICD-10-CM | POA: Diagnosis not present

## 2019-10-15 DIAGNOSIS — E876 Hypokalemia: Secondary | ICD-10-CM | POA: Diagnosis not present

## 2019-10-15 DIAGNOSIS — J9811 Atelectasis: Secondary | ICD-10-CM | POA: Diagnosis not present

## 2019-10-15 DIAGNOSIS — D72829 Elevated white blood cell count, unspecified: Secondary | ICD-10-CM

## 2019-10-15 DIAGNOSIS — E0590 Thyrotoxicosis, unspecified without thyrotoxic crisis or storm: Secondary | ICD-10-CM | POA: Diagnosis not present

## 2019-10-15 DIAGNOSIS — F332 Major depressive disorder, recurrent severe without psychotic features: Secondary | ICD-10-CM | POA: Diagnosis not present

## 2019-10-15 DIAGNOSIS — I7102 Dissection of abdominal aorta: Secondary | ICD-10-CM | POA: Diagnosis not present

## 2019-10-15 DIAGNOSIS — D62 Acute posthemorrhagic anemia: Secondary | ICD-10-CM

## 2019-10-15 DIAGNOSIS — S3501XA Minor laceration of abdominal aorta, initial encounter: Secondary | ICD-10-CM | POA: Diagnosis not present

## 2019-10-15 DIAGNOSIS — K661 Hemoperitoneum: Secondary | ICD-10-CM | POA: Diagnosis not present

## 2019-10-15 HISTORY — PX: IR VENOCAVAGRAM IVC: IMG678

## 2019-10-15 HISTORY — PX: IR VENOGRAM RENAL UNI LEFT: IMG680

## 2019-10-15 HISTORY — PX: ABDOMINAL AORTIC ENDOVASCULAR STENT GRAFT: SHX5707

## 2019-10-15 HISTORY — PX: IR RENAL SELECTIVE  UNI INC S&I MOD SED: IMG654

## 2019-10-15 HISTORY — PX: IR US GUIDE VASC ACCESS RIGHT: IMG2390

## 2019-10-15 LAB — MAGNESIUM
Magnesium: 2.2 mg/dL (ref 1.7–2.4)
Magnesium: 2.4 mg/dL (ref 1.7–2.4)

## 2019-10-15 LAB — CBC WITH DIFFERENTIAL/PLATELET
Abs Immature Granulocytes: 0.16 10*3/uL — ABNORMAL HIGH (ref 0.00–0.07)
Abs Immature Granulocytes: 0.21 10*3/uL — ABNORMAL HIGH (ref 0.00–0.07)
Basophils Absolute: 0 10*3/uL (ref 0.0–0.1)
Basophils Absolute: 0 10*3/uL (ref 0.0–0.1)
Basophils Relative: 0 %
Basophils Relative: 0 %
Eosinophils Absolute: 0 10*3/uL (ref 0.0–0.5)
Eosinophils Absolute: 0 10*3/uL (ref 0.0–0.5)
Eosinophils Relative: 0 %
Eosinophils Relative: 0 %
HCT: 25.7 % — ABNORMAL LOW (ref 36.0–46.0)
HCT: 23.6 % — ABNORMAL LOW (ref 36.0–46.0)
Hemoglobin: 8.6 g/dL — ABNORMAL LOW (ref 12.0–15.0)
Hemoglobin: 7.7 g/dL — ABNORMAL LOW (ref 12.0–15.0)
Immature Granulocytes: 1 %
Immature Granulocytes: 1 %
Lymphocytes Relative: 6 %
Lymphocytes Relative: 5 %
Lymphs Abs: 1.3 10*3/uL (ref 0.7–4.0)
Lymphs Abs: 1.2 10*3/uL (ref 0.7–4.0)
MCH: 30.3 pg (ref 26.0–34.0)
MCH: 29.8 pg (ref 26.0–34.0)
MCHC: 33.5 g/dL (ref 30.0–36.0)
MCHC: 32.6 g/dL (ref 30.0–36.0)
MCV: 90.5 fL (ref 80.0–100.0)
MCV: 91.5 fL (ref 80.0–100.0)
Monocytes Absolute: 1 10*3/uL (ref 0.1–1.0)
Monocytes Absolute: 1.1 10*3/uL — ABNORMAL HIGH (ref 0.1–1.0)
Monocytes Relative: 5 %
Monocytes Relative: 4 %
Neutro Abs: 19.2 10*3/uL — ABNORMAL HIGH (ref 1.7–7.7)
Neutro Abs: 22 10*3/uL — ABNORMAL HIGH (ref 1.7–7.7)
Neutrophils Relative %: 88 %
Neutrophils Relative %: 90 %
Platelets: 318 10*3/uL (ref 150–400)
Platelets: 322 10*3/uL (ref 150–400)
RBC: 2.84 MIL/uL — ABNORMAL LOW (ref 3.87–5.11)
RBC: 2.58 MIL/uL — ABNORMAL LOW (ref 3.87–5.11)
RDW: 14.6 % (ref 11.5–15.5)
RDW: 15.2 % (ref 11.5–15.5)
WBC: 21.7 10*3/uL — ABNORMAL HIGH (ref 4.0–10.5)
WBC: 24.5 10*3/uL — ABNORMAL HIGH (ref 4.0–10.5)
nRBC: 0 % (ref 0.0–0.2)
nRBC: 0 % (ref 0.0–0.2)

## 2019-10-15 LAB — APTT
aPTT: 29 seconds (ref 24–36)
aPTT: 24 seconds (ref 24–36)

## 2019-10-15 LAB — HEMOGLOBIN AND HEMATOCRIT, BLOOD
HCT: 26.4 % — ABNORMAL LOW (ref 36.0–46.0)
Hemoglobin: 8.9 g/dL — ABNORMAL LOW (ref 12.0–15.0)

## 2019-10-15 LAB — BASIC METABOLIC PANEL
Anion gap: 8 (ref 5–15)
Anion gap: 10 (ref 5–15)
BUN: 13 mg/dL (ref 6–20)
BUN: 25 mg/dL — ABNORMAL HIGH (ref 6–20)
CO2: 28 mmol/L (ref 22–32)
CO2: 24 mmol/L (ref 22–32)
Calcium: 8.9 mg/dL (ref 8.9–10.3)
Calcium: 8.5 mg/dL — ABNORMAL LOW (ref 8.9–10.3)
Chloride: 101 mmol/L (ref 98–111)
Chloride: 104 mmol/L (ref 98–111)
Creatinine, Ser: 1.04 mg/dL — ABNORMAL HIGH (ref 0.44–1.00)
Creatinine, Ser: 1.13 mg/dL — ABNORMAL HIGH (ref 0.44–1.00)
GFR calc Af Amer: >60 mL/min (ref >60)
GFR calc Af Amer: >60 mL/min (ref >60)
GFR calc non Af Amer: >60 mL/min (ref >60)
GFR calc non Af Amer: 55 mL/min — ABNORMAL LOW (ref >60)
Glucose, Bld: 136 mg/dL — ABNORMAL HIGH (ref 70–99)
Glucose, Bld: 122 mg/dL — ABNORMAL HIGH (ref 70–99)
Potassium: 4.8 mmol/L (ref 3.5–5.1)
Potassium: 5 mmol/L (ref 3.5–5.1)
Sodium: 137 mmol/L (ref 135–145)
Sodium: 138 mmol/L (ref 135–145)

## 2019-10-15 LAB — PROTIME-INR
INR: 1.1 (ref 0.8–1.2)
INR: 1.2 (ref 0.8–1.2)
Prothrombin Time: 14.3 seconds (ref 11.4–15.2)
Prothrombin Time: 14.9 seconds (ref 11.4–15.2)

## 2019-10-15 LAB — POCT I-STAT, CHEM 8
BUN: 26 mg/dL — ABNORMAL HIGH (ref 6–20)
Calcium, Ion: 1.23 mmol/L (ref 1.15–1.40)
Chloride: 101 mmol/L (ref 98–111)
Creatinine, Ser: 1 mg/dL (ref 0.44–1.00)
Glucose, Bld: 118 mg/dL — ABNORMAL HIGH (ref 70–99)
HCT: 20 % — ABNORMAL LOW (ref 36.0–46.0)
Hemoglobin: 6.8 g/dL — CL (ref 12.0–15.0)
Potassium: 5.1 mmol/L (ref 3.5–5.1)
Sodium: 135 mmol/L (ref 135–145)
TCO2: 27 mmol/L (ref 22–32)

## 2019-10-15 LAB — PREPARE RBC (CROSSMATCH)

## 2019-10-15 LAB — ABO/RH: ABO/RH(D): A POS

## 2019-10-15 LAB — HIV ANTIBODY (ROUTINE TESTING W REFLEX): HIV Screen 4th Generation wRfx: NONREACTIVE

## 2019-10-15 LAB — PHOSPHORUS: Phosphorus: 5.9 mg/dL — ABNORMAL HIGH (ref 2.5–4.6)

## 2019-10-15 SURGERY — INSERTION, ENDOVASCULAR STENT GRAFT, AORTA, ABDOMINAL
Anesthesia: General | Site: Abdomen

## 2019-10-15 MED ORDER — PANTOPRAZOLE SODIUM 40 MG PO TBEC
40.0000 mg | DELAYED_RELEASE_TABLET | Freq: Every day | ORAL | Status: DC
  Administered 2019-10-16 – 2019-10-18 (×3): 40 mg via ORAL
  Filled 2019-10-15 (×3): qty 1

## 2019-10-15 MED ORDER — HYDRALAZINE HCL 20 MG/ML IJ SOLN: 5.0000 mg | INTRAMUSCULAR | Status: DC | PRN

## 2019-10-15 MED ORDER — HYDROMORPHONE HCL 1 MG/ML IJ SOLN
INTRAMUSCULAR | Status: AC
  Filled 2019-10-15: qty 1

## 2019-10-15 MED ORDER — HEPARIN SODIUM (PORCINE) 1000 UNIT/ML IJ SOLN
INTRAMUSCULAR | Status: DC | PRN
  Administered 2019-10-15: 5000 [IU] via INTRAVENOUS

## 2019-10-15 MED ORDER — PROTAMINE SULFATE 10 MG/ML IV SOLN
INTRAVENOUS | Status: DC | PRN
  Administered 2019-10-15: 30 mg via INTRAVENOUS

## 2019-10-15 MED ORDER — DEXAMETHASONE SODIUM PHOSPHATE 10 MG/ML IJ SOLN
INTRAMUSCULAR | Status: AC
  Filled 2019-10-15: qty 1

## 2019-10-15 MED ORDER — SODIUM CHLORIDE 0.9% IV SOLUTION: Freq: Once | INTRAVENOUS | Status: AC

## 2019-10-15 MED ORDER — PROTAMINE SULFATE 10 MG/ML IV SOLN
INTRAVENOUS | Status: AC
  Filled 2019-10-15: qty 5

## 2019-10-15 MED ORDER — SODIUM CHLORIDE 0.9 % IV SOLN: INTRAVENOUS | Status: DC

## 2019-10-15 MED ORDER — SODIUM CHLORIDE 0.9 % IV SOLN
INTRAVENOUS | Status: AC
  Filled 2019-10-15: qty 1.2

## 2019-10-15 MED ORDER — IODIXANOL 320 MG/ML IV SOLN
100.0000 mL | Freq: Once | INTRAVENOUS | Status: AC | PRN
  Administered 2019-10-15: 40 mL via INTRA_ARTERIAL

## 2019-10-15 MED ORDER — PROPOFOL 10 MG/ML IV BOLUS
INTRAVENOUS | Status: DC | PRN
  Administered 2019-10-15: 140 mg via INTRAVENOUS

## 2019-10-15 MED ORDER — LIDOCAINE HCL (CARDIAC) PF 100 MG/5ML IV SOSY
PREFILLED_SYRINGE | INTRAVENOUS | Status: DC | PRN
  Administered 2019-10-15: 50 mg via INTRAVENOUS

## 2019-10-15 MED ORDER — MIDAZOLAM HCL 2 MG/2ML IJ SOLN
INTRAMUSCULAR | Status: AC
  Filled 2019-10-15: qty 2

## 2019-10-15 MED ORDER — ALUM & MAG HYDROXIDE-SIMETH 200-200-20 MG/5ML PO SUSP: 15.0000 mL | ORAL | Status: DC | PRN

## 2019-10-15 MED ORDER — IODIXANOL 320 MG/ML IV SOLN
100.0000 mL | Freq: Once | INTRAVENOUS | Status: AC | PRN
  Administered 2019-10-15: 60 mL via INTRA_ARTERIAL

## 2019-10-15 MED ORDER — ACETAMINOPHEN 325 MG PO TABS
325.0000 mg | ORAL_TABLET | ORAL | Status: DC | PRN
  Administered 2019-10-16 – 2019-10-17 (×2): 650 mg via ORAL
  Filled 2019-10-15 (×3): qty 2

## 2019-10-15 MED ORDER — ROCURONIUM BROMIDE 100 MG/10ML IV SOLN
INTRAVENOUS | Status: DC | PRN
  Administered 2019-10-15: 70 mg via INTRAVENOUS

## 2019-10-15 MED ORDER — BUSPIRONE HCL 5 MG PO TABS
5.0000 mg | ORAL_TABLET | Freq: Two times a day (BID) | ORAL | Status: DC
  Administered 2019-10-16 – 2019-10-18 (×5): 5 mg via ORAL
  Filled 2019-10-15 (×5): qty 1

## 2019-10-15 MED ORDER — SUCCINYLCHOLINE CHLORIDE 200 MG/10ML IV SOSY
PREFILLED_SYRINGE | INTRAVENOUS | Status: AC
  Filled 2019-10-15: qty 10

## 2019-10-15 MED ORDER — CHLORHEXIDINE GLUCONATE CLOTH 2 % EX PADS
6.0000 | MEDICATED_PAD | Freq: Every day | CUTANEOUS | Status: DC
  Administered 2019-10-16 – 2019-10-17 (×2): 6 via TOPICAL

## 2019-10-15 MED ORDER — LACTATED RINGERS IV SOLN: INTRAVENOUS | Status: DC | PRN

## 2019-10-15 MED ORDER — IODIXANOL 320 MG/ML IV SOLN
100.0000 mL | Freq: Once | INTRAVENOUS | Status: AC | PRN
  Administered 2019-10-15: 19:00:00 50 mL via INTRAVENOUS

## 2019-10-15 MED ORDER — FENTANYL CITRATE (PF) 100 MCG/2ML IJ SOLN
INTRAMUSCULAR | Status: AC
  Filled 2019-10-15: qty 2

## 2019-10-15 MED ORDER — SODIUM CHLORIDE 0.9 % IV SOLN: 500.0000 mL | Freq: Once | INTRAVENOUS | Status: DC | PRN

## 2019-10-15 MED ORDER — HYDROMORPHONE HCL 1 MG/ML IJ SOLN
INTRAMUSCULAR | Status: AC | PRN
  Administered 2019-10-15: .25 mg via INTRAVENOUS
  Administered 2019-10-15: 0.5 mg via INTRAVENOUS
  Administered 2019-10-15: .25 mg via INTRAVENOUS

## 2019-10-15 MED ORDER — DOCUSATE SODIUM 100 MG PO CAPS
100.0000 mg | ORAL_CAPSULE | Freq: Every day | ORAL | Status: DC
  Administered 2019-10-16: 100 mg via ORAL
  Filled 2019-10-15: qty 1

## 2019-10-15 MED ORDER — ROCURONIUM BROMIDE 10 MG/ML (PF) SYRINGE
PREFILLED_SYRINGE | INTRAVENOUS | Status: AC
  Filled 2019-10-15: qty 10

## 2019-10-15 MED ORDER — ASPIRIN EC 81 MG PO TBEC
81.0000 mg | DELAYED_RELEASE_TABLET | Freq: Every day | ORAL | Status: DC
  Administered 2019-10-18: 81 mg via ORAL
  Filled 2019-10-15: qty 1

## 2019-10-15 MED ORDER — GUAIFENESIN-DM 100-10 MG/5ML PO SYRP
15.0000 mL | ORAL_SOLUTION | ORAL | Status: DC | PRN
  Administered 2019-10-17: 15 mL via ORAL
  Filled 2019-10-15: qty 15

## 2019-10-15 MED ORDER — METOPROLOL TARTRATE 5 MG/5ML IV SOLN: 2.0000 mg | INTRAVENOUS | Status: DC | PRN

## 2019-10-15 MED ORDER — 0.9 % SODIUM CHLORIDE (POUR BTL) OPTIME
TOPICAL | Status: DC | PRN
  Administered 2019-10-15: 1000 mL

## 2019-10-15 MED ORDER — PHENOL 1.4 % MT LIQD: 1.0000 | OROMUCOSAL | Status: DC | PRN

## 2019-10-15 MED ORDER — HEPARIN SODIUM (PORCINE) 1000 UNIT/ML IJ SOLN
INTRAMUSCULAR | Status: AC
  Filled 2019-10-15: qty 3

## 2019-10-15 MED ORDER — LIDOCAINE 2% (20 MG/ML) 5 ML SYRINGE
INTRAMUSCULAR | Status: AC
  Filled 2019-10-15: qty 10

## 2019-10-15 MED ORDER — PROPOFOL 10 MG/ML IV BOLUS
INTRAVENOUS | Status: AC
  Filled 2019-10-15: qty 20

## 2019-10-15 MED ORDER — EPHEDRINE 5 MG/ML INJ
INTRAVENOUS | Status: AC
  Filled 2019-10-15: qty 10

## 2019-10-15 MED ORDER — ACETAMINOPHEN 325 MG RE SUPP: 325.0000 mg | RECTAL | Status: DC | PRN

## 2019-10-15 MED ORDER — VANCOMYCIN HCL IN DEXTROSE 1-5 GM/200ML-% IV SOLN
1000.0000 mg | Freq: Two times a day (BID) | INTRAVENOUS | Status: AC
  Administered 2019-10-16 (×2): 1000 mg via INTRAVENOUS
  Filled 2019-10-15 (×2): qty 200

## 2019-10-15 MED ORDER — LABETALOL HCL 5 MG/ML IV SOLN: 5.0000 mg | INTRAVENOUS | Status: DC | PRN

## 2019-10-15 MED ORDER — SUGAMMADEX SODIUM 200 MG/2ML IV SOLN
INTRAVENOUS | Status: DC | PRN
  Administered 2019-10-15: 200 mg via INTRAVENOUS

## 2019-10-15 MED ORDER — HYDROMORPHONE HCL 1 MG/ML IJ SOLN: 0.5000 mg | INTRAMUSCULAR | Status: DC | PRN

## 2019-10-15 MED ORDER — ESMOLOL HCL 100 MG/10ML IV SOLN
INTRAVENOUS | Status: DC | PRN
  Administered 2019-10-15: 60 mg via INTRAVENOUS

## 2019-10-15 MED ORDER — IODIXANOL 320 MG/ML IV SOLN
100.0000 mL | Freq: Once | INTRAVENOUS | Status: AC | PRN
  Administered 2019-10-15: 19:00:00 50 mL via INTRA_ARTERIAL

## 2019-10-15 MED ORDER — LIDOCAINE HCL 1 % IJ SOLN
INTRAMUSCULAR | Status: AC
  Filled 2019-10-15: qty 20

## 2019-10-15 MED ORDER — CEFAZOLIN SODIUM-DEXTROSE 2-3 GM-%(50ML) IV SOLR
INTRAVENOUS | Status: DC | PRN
  Administered 2019-10-15: 2 g via INTRAVENOUS

## 2019-10-15 MED ORDER — ONDANSETRON HCL 4 MG/2ML IJ SOLN
INTRAMUSCULAR | Status: AC
  Filled 2019-10-15: qty 2

## 2019-10-15 MED ORDER — SODIUM CHLORIDE 0.9 % IV SOLN
INTRAVENOUS | Status: DC | PRN
  Administered 2019-10-15: 500 mL

## 2019-10-15 MED ORDER — MIDAZOLAM HCL 2 MG/2ML IJ SOLN
INTRAMUSCULAR | Status: AC | PRN
  Administered 2019-10-15 (×9): 0.5 mg via INTRAVENOUS
  Administered 2019-10-15: 1 mg via INTRAVENOUS
  Administered 2019-10-15: 0.5 mg via INTRAVENOUS

## 2019-10-15 MED ORDER — IODIXANOL 320 MG/ML IV SOLN
INTRAVENOUS | Status: DC | PRN
  Administered 2019-10-15: 150 mL

## 2019-10-15 SURGICAL SUPPLY — 56 items
CANISTER SUCT 3000ML PPV (MISCELLANEOUS) ×2 IMPLANT
CATH BEACON 5.038 65CM KMP-01 (CATHETERS) IMPLANT
CATH OMNI FLUSH .035X70CM (CATHETERS) IMPLANT
COVER WAND RF STERILE (DRAPES) ×2 IMPLANT
DERMABOND ADVANCED (GAUZE/BANDAGES/DRESSINGS) ×1
DERMABOND ADVANCED .7 DNX12 (GAUZE/BANDAGES/DRESSINGS) ×1 IMPLANT
DEVICE CLOSURE PERCLS PRGLD 6F (VASCULAR PRODUCTS) IMPLANT
DEVICE TORQUE KENDALL .025-038 (MISCELLANEOUS) ×2 IMPLANT
DRSG TEGADERM 2-3/8X2-3/4 SM (GAUZE/BANDAGES/DRESSINGS) ×2 IMPLANT
DRYSEAL FLEXSHEATH 16FR 33CM (SHEATH) ×1
ELECT REM PT RETURN 9FT ADLT (ELECTROSURGICAL) ×4
ELECTRODE REM PT RTRN 9FT ADLT (ELECTROSURGICAL) ×2 IMPLANT
GAUZE SPONGE 2X2 8PLY NS (GAUZE/BANDAGES/DRESSINGS) ×2 IMPLANT
GAUZE SPONGE 2X2 8PLY STRL LF (GAUZE/BANDAGES/DRESSINGS) ×1 IMPLANT
GLOVE BIO SURGEON STRL SZ 6.5 (GLOVE) ×2 IMPLANT
GLOVE BIO SURGEON STRL SZ7.5 (GLOVE) ×2 IMPLANT
GLOVE BIOGEL PI IND STRL 6.5 (GLOVE) ×2 IMPLANT
GLOVE BIOGEL PI IND STRL 7.0 (GLOVE) ×1 IMPLANT
GLOVE BIOGEL PI IND STRL 7.5 (GLOVE) ×2 IMPLANT
GLOVE BIOGEL PI IND STRL 8 (GLOVE) ×1 IMPLANT
GLOVE BIOGEL PI INDICATOR 6.5 (GLOVE) ×2
GLOVE BIOGEL PI INDICATOR 7.0 (GLOVE) ×1
GLOVE BIOGEL PI INDICATOR 7.5 (GLOVE) ×2
GLOVE BIOGEL PI INDICATOR 8 (GLOVE) ×1
GOWN STRL REUS W/ TWL LRG LVL3 (GOWN DISPOSABLE) ×3 IMPLANT
GOWN STRL REUS W/ TWL XL LVL3 (GOWN DISPOSABLE) ×1 IMPLANT
GOWN STRL REUS W/TWL LRG LVL3 (GOWN DISPOSABLE) ×3
GOWN STRL REUS W/TWL XL LVL3 (GOWN DISPOSABLE) ×1
GRAFT BALLN CATH 65CM (STENTS) IMPLANT
GRAFT EXCLUDER AORTIC 23X3.3CM (Endovascular Graft) ×4 IMPLANT
GUIDEWIRE ANGLED .035X150CM (WIRE) ×2 IMPLANT
KIT BASIN OR (CUSTOM PROCEDURE TRAY) ×2 IMPLANT
KIT TURNOVER KIT B (KITS) ×2 IMPLANT
NS IRRIG 1000ML POUR BTL (IV SOLUTION) ×2 IMPLANT
PACK ENDOVASCULAR (PACKS) ×2 IMPLANT
PAD ARMBOARD 7.5X6 YLW CONV (MISCELLANEOUS) ×4 IMPLANT
PERCLOSE PROGLIDE 6F (VASCULAR PRODUCTS)
SET MICROPUNCTURE 5F STIFF (MISCELLANEOUS) ×2 IMPLANT
SHEATH BRITE TIP 8FR 23CM (SHEATH) IMPLANT
SHEATH DRYSEAL FLEX 16FR 33CM (SHEATH) ×1 IMPLANT
SHEATH PINNACLE 8F 10CM (SHEATH) IMPLANT
SPONGE GAUZE 2X2 STER 10/PKG (GAUZE/BANDAGES/DRESSINGS) ×1
STENT GRAFT BALLN CATH 65CM (STENTS)
STOPCOCK MORSE 400PSI 3WAY (MISCELLANEOUS) ×2 IMPLANT
SUT ETHILON 3 0 PS 1 (SUTURE) IMPLANT
SUT MNCRL AB 4-0 PS2 18 (SUTURE) ×2 IMPLANT
SUT PROLENE 5 0 C 1 24 (SUTURE) IMPLANT
SUT VIC AB 2-0 CTX 36 (SUTURE) IMPLANT
SUT VIC AB 3-0 SH 27 (SUTURE)
SUT VIC AB 3-0 SH 27X BRD (SUTURE) IMPLANT
SYR 20ML LL LF (SYRINGE) ×2 IMPLANT
TOWEL GREEN STERILE (TOWEL DISPOSABLE) ×2 IMPLANT
TRAY FOLEY MTR SLVR 16FR STAT (SET/KITS/TRAYS/PACK) ×2 IMPLANT
TUBING HIGH PRESSURE 120CM (CONNECTOR) ×2 IMPLANT
WIRE AMPLATZ SS-J .035X180CM (WIRE) IMPLANT
WIRE BENTSON .035X145CM (WIRE) IMPLANT

## 2019-10-15 NOTE — Anesthesia Procedure Notes (Signed)
Central Venous Catheter Insertion Performed by: Val Eagle, MD, anesthesiologist Start/End2/08/2020 8:05 PM, 10/15/2019 8:11 PM Patient location: OR. Preanesthetic checklist: patient identified, IV checked, site marked, risks and benefits discussed, surgical consent, monitors and equipment checked, pre-op evaluation, timeout performed and anesthesia consent Lidocaine 1% used for infiltration and patient sedated Hand hygiene performed  and maximum sterile barriers used  Catheter size: 8 Fr Total catheter length 16. Central line was placed.Double lumen Procedure performed using ultrasound guided technique. Ultrasound Notes:anatomy identified, needle tip was noted to be adjacent to the nerve/plexus identified, no ultrasound evidence of intravascular and/or intraneural injection and image(s) printed for medical record Attempts: 1 Following insertion, dressing applied, line sutured and Biopatch. Post procedure assessment: blood return through all ports, free fluid flow and no air  Patient tolerated the procedure well with no immediate complications.

## 2019-10-15 NOTE — Anesthesia Procedure Notes (Signed)
Procedure Name: Intubation Date/Time: 10/15/2019 8:08 PM Performed by: Molli Hazard, CRNA Pre-anesthesia Checklist: Patient identified, Emergency Drugs available, Suction available and Patient being monitored Patient Re-evaluated:Patient Re-evaluated prior to induction Oxygen Delivery Method: Circle System Utilized Preoxygenation: Pre-oxygenation with 100% oxygen Induction Type: IV induction Ventilation: Mask ventilation without difficulty Laryngoscope Size: Miller and 2 Grade View: Grade I Tube type: Oral Tube size: 7.5 mm Number of attempts: 1 Airway Equipment and Method: Stylet Placement Confirmation: ETT inserted through vocal cords under direct vision,  positive ETCO2 and breath sounds checked- equal and bilateral Secured at: 22 cm Tube secured with: Tape Dental Injury: Teeth and Oropharynx as per pre-operative assessment

## 2019-10-15 NOTE — Progress Notes (Signed)
Called to update daughter Jeral Pinch, per pt request.  Theophilus Kinds, RN

## 2019-10-15 NOTE — Sedation Documentation (Signed)
Patient is resting comfortably. 

## 2019-10-15 NOTE — Procedures (Signed)
Interventional Radiology Procedure Note  Procedure: Abdominal aortogram  Complications: None  Estimated Blood Loss: < 10 mL  Findings: Focal contrast extravasation from abdominal aorta roughly 2 cm inferior to left renal artery along left side of aorta. Could not locate exact level of extravasation or catheterize area, but felt to be near origin of left ovarian vein.  Left renal arteriogram is normal.  IVC and left renal venography via femoral venous access normal.  Consulted Dr. Chestine Spore with Vascular Surgery during case who was present to review findings for endovascular repair.  Jodi Marble. Fredia Sorrow, M.D Pager:  [phone]

## 2019-10-15 NOTE — Consult Note (Addendum)
Hospital Consult    Reason for Consult: Extravasation from abdominal aorta, left RP hemorrage Referring Physician: IR Dr. Fredia Sorrow MRN #:  [id number]  History of Present Illness: This is a 54 y.o. female with history of depression who initially presented to [facility] on 10/14/2019 with 1 week of abdominal pain.  Ultimately she was found to have acute blood loss anemia and a CT confirmed a large left retroperitoneal hemorrhage.  Ultimately she has undergone extensive work-up with unclear etiology.  She denies any associated trauma.  She had a procedure in IR today with findings of focal contrast extravasation from the abdominal aorta roughly 2 cm inferior to the left renal artery suspected to be along the left side of the aorta.  Ultimately vascular surgery was consulted while the patient was in IR.  She is currently getting active blood transfusion.  In review of records her hemoglobin has dropped from 13.1 to 7.7.  Past Medical History:  Diagnosis Date  . Depression   . Primary hyperthyroidism     Past Surgical History:  Procedure Laterality Date  . BRAIN SURGERY    . CESAREAN SECTION      Allergies  Allergen Reactions  . Codeine Hives, Itching and Swelling  . Fentanyl Citrate Itching  . Hydrocodone-Acetaminophen Hives, Itching and Swelling  . Oxycodone-Acetaminophen Hives, Itching, Nausea And Vomiting and Swelling  . Penicillins Hives, Itching and Swelling  . Tramadol Hives, Itching and Swelling    Prior to Admission medications   Medication Sig Start Date End Date Taking? Authorizing Provider  buPROPion (WELLBUTRIN XL) 150 MG 24 hr tablet Take 1 tablet (150 mg total) by mouth daily. For depression Patient taking differently: Take 450 mg by mouth daily. For depression 08/12/19  Yes Nwoko, Nicole Kindred I, NP  busPIRone (BUSPAR) 5 MG tablet Take 1 tablet (5 mg total) by mouth 2 (two) times daily. For anxiety 08/12/19  Yes Armandina Stammer I, NP  cefPROZIL (CEFZIL) 250 MG tablet  Take 250 mg by mouth 2 (two) times daily. 10 day course prescribed on 10/06/2019 10/06/19 10/16/19 Yes [provider]  Multiple Vitamin (MULTIVITAMIN WITH MINERALS) TABS tablet Take 1 tablet by mouth daily.   Yes [provider]  psyllium (METAMUCIL) 58.6 % powder Take 1 packet by mouth once as needed (for constipation).   Yes [provider]    Social History   Socioeconomic History  . Marital status: Divorced    Spouse name: Not on file  . Number of children: Not on file  . Years of education: Not on file  . Highest education level: Not on file  Occupational History  . Not on file  Tobacco Use  . Smoking status: Never Smoker  . Smokeless tobacco: Never Used  Substance and Sexual Activity  . Alcohol use: Not Currently    Comment: occasionally  . Drug use: Not Currently    Types: Marijuana  . Sexual activity: Yes    Birth control/protection: None  Other Topics Concern  . Not on file  Social History Narrative  . Not on file   Social Determinants of Health   Financial Resource Strain:   . Difficulty of Paying Living Expenses: Not on file  Food Insecurity:   . Worried About Programme researcher, broadcasting/film/video in the Last Year: Not on file  . Ran Out of Food in the Last Year: Not on file  Transportation Needs:   . Lack of Transportation (Medical): Not on file  . Lack of Transportation (Non-Medical): Not  on file  Physical Activity:   . Days of Exercise per Week: Not on file  . Minutes of Exercise per Session: Not on file  Stress:   . Feeling of Stress : Not on file  Social Connections:   . Frequency of Communication with Friends and Family: Not on file  . Frequency of Social Gatherings with Friends and Family: Not on file  . Attends Religious Services: Not on file  . Active Member of Clubs or Organizations: Not on file  . Attends Archivist Meetings: Not on file  . Marital Status: Not on file  Intimate Partner Violence:   . Fear of Current or  Ex-Partner: Not on file  . Emotionally Abused: Not on file  . Physically Abused: Not on file  . Sexually Abused: Not on file     Family History  Problem Relation Age of Onset  . Thyroid disease Neg Hx     ROS: [x]  Positive   [ ]  Negative   [ ]  All sytems reviewed and are negative  Cardiovascular: []  chest pain/pressure []  palpitations []  SOB lying flat []  DOE []  pain in legs while walking []  pain in legs at rest []  pain in legs at night []  non-healing ulcers []  hx of DVT []  swelling in legs  Pulmonary: []  productive cough []  asthma/wheezing []  home O2  Neurologic: []  weakness in []  arms []  legs []  numbness in []  arms []  legs []  hx of CVA []  mini stroke [] difficulty speaking or slurred speech []  temporary loss of vision in one eye []  dizziness  Hematologic: []  hx of cancer []  bleeding problems []  problems with blood clotting easily  Endocrine:   []  diabetes []  thyroid disease  GI []  vomiting blood []  blood in stool  GU: []  CKD/renal failure []  HD--[]  M/W/F or []  T/T/S []  burning with urination []  blood in urine  Psychiatric: []  anxiety []  depression  Musculoskeletal: []  arthritis []  joint pain  Integumentary: []  rashes []  ulcers  Constitutional: []  fever []  chills   Physical Examination  Vitals:   10/15/19 1850 10/15/19 1855  BP: 139/84 (!) 151/79  Pulse: 86 86  Resp: 17 14  Temp:    SpO2: 100% 100%   Body mass index is 21.93 kg/m.  General:  NAD, flat on IR table Gait: Not observed HENT: WNL, normocephalic Pulmonary: normal non-labored breathing, without Rales, rhonchi,  wheezing Cardiac: regular, without  Murmurs, rubs or gallops Abdomen: soft, NT/ND, no masses Vascular Exam/Pulses: Unable to assess - sterile procedure Musculoskeletal: no muscle wasting or atrophy  Neurologic: A&O X 3; Appropriate Affect ; SENSATION: normal; MOTOR FUNCTION:  moving all extremities equally. Speech is fluent/normal   CBC    Component  Value Date/Time   WBC 24.5 (H) 10/15/2019 0854   RBC 2.58 (L) 10/15/2019 0854   HGB 7.7 (L) 10/15/2019 0854   HCT 23.6 (L) 10/15/2019 0854   PLT 322 10/15/2019 0854   MCV 91.5 10/15/2019 0854   MCH 29.8 10/15/2019 0854   MCHC 32.6 10/15/2019 0854   RDW 15.2 10/15/2019 0854   LYMPHSABS 1.2 10/15/2019 0854   MONOABS 1.1 (H) 10/15/2019 0854   EOSABS 0.0 10/15/2019 0854   BASOSABS 0.0 10/15/2019 0854    BMET    Component Value Date/Time   NA 137 10/15/2019 0254   K 4.8 10/15/2019 0254   CL 101 10/15/2019 0254   CO2 28 10/15/2019 0254   GLUCOSE 136 (H) 10/15/2019 0254   BUN 13 10/15/2019 0254  CREATININE 1.04 (H) 10/15/2019 0254   CALCIUM 8.9 10/15/2019 0254   GFRNONAA >60 10/15/2019 0254   GFRAA >60 10/15/2019 0254    COAGS: Lab Results  Component Value Date   INR 1.1 10/15/2019     Non-Invasive Vascular Imaging:     I reviewed images from IR and there appears to be a blush off the infrarenal aorta.  No evidence of blush off the venacavogram or after cannulation of the left renal vein.  Certainly would suggest arterial etiology.  Reviewed CT from today and agree there is concern for pseudoaneurysm adjacent to the abdominal aorta.  Exact etiology is unclear to me.   ASSESSMENT/PLAN: This is a 54 y.o. female who presents with acute blood loss anemia requiring ongoing transfusions as well as large left retroperitoneal hemorrhage.  After review of images from IR agree that this does appear to be aortic source from the infrarenal aorta although the exact etiology is unclear after review of both the CT and the IR arteriogram images.  I recommended proceeding to the OR for aortogram and infrarenal abdominal aortic stent.  I discussed with the patient there is some risk that this may not completely correct her active extravasation given this has been so difficult to pinpoint the exact source.  If she continues to drop after this may require open exploration but would hopefully  leave that as a last resort.  Cephus Shelling, MD [facility] Office: [phone]

## 2019-10-15 NOTE — Progress Notes (Signed)
Received call from Dr Loreta Ave with IR, he is unable to see patient at the moment and to call him when needed.

## 2019-10-15 NOTE — Progress Notes (Addendum)
PROGRESS NOTE   Shelley Hines  HCW:[id number]    DOB: 1965/11/27    DOA: 10/14/2019  PCP: Lavella Lemons, PA   I have briefly reviewed patients previous medical records in [facility].  Chief Complaint:   Chief Complaint  Patient presents with  . Abdominal Pain    Brief Narrative:  54 year old female, independent and reportedly lives with her friend, PMH of depression, prior alcohol dependence, chronic constipation,?  Rectal prolapse, hypothyroidism, initially presented to [facility] ED on 10/14/2019 due to 1 week history of abdominal pain.  Evaluation revealed acute blood loss anemia (hemoglobin dropped from baseline of 13.1-8.2), negative Covid testing, CT abdomen and pelvis with contrast confirmed large left retroperitoneal hemorrhage concerning to be coming from ruptured or avulsed left ovarian vein.  No clear arterial bleeding source noted.  Case was discussed with general surgery and OB/GYN who had no surgical interventions to offer and recommended IR consultation for possible embolization.  She was transferred to [facility] overnight 2/11.  IR have consulted, getting CTA abdomen and pelvis to identify source for possible intervention.   Assessment & Plan:  Active Problems:   Retroperitoneal hemorrhage   Large left retroperitoneal hemorrhage: Exact source not clear yet.  Some concern for ruptured or avulsed left ovarian vein.  OB/GYN and general surgery did not have any surgical recommendations.  IR consulted, getting CTA abdomen and pelvis with GI bleed protocol to rule out AV fistula or occult arterial source of bleeding.  Based on this and other studies, will consider embolization.  Continue NPO, gentle IV fluids and pain management.  Acute blood loss anemia: Due to retroperitoneal hemorrhage.  Hemoglobin 13.1 on 08/08/2019.  Presented with hemoglobin of 8.2.  S/p 1 unit PRBC.  Hemoglobin has further dropped to 7.7 on serial trending.  Will transfuse  additional unit PRBC, follow CBCs closely and aim to keep hemoglobin >8 g per DL.  Hypokalemia: Replaced.  Magnesium normal.  Alcohol dependence: Patient reports that she quit several months ago when I have no reason not to believe her.  DC CIWA protocol.  Elevated blood pressure: Likely stress response related to pain.  As needed IV labetalol.  Depression: Stable.  Continue prior home medications.  Bupropion, buspirone,.    Leukocytosis: Likely stress response.  Although CT abdomen suggested pyelonephritis, no urinary symptoms and UA not supportive of UTI.  Body mass index is 21.93 kg/m.   DVT prophylaxis: SCDs Code Status: Full Family Communication: None at bedside.  Discussed in detail with patient's daughter via phone, updated care and answered questions. Disposition:  . Patient came from: Home           . Anticipated d/c place: Home . Barriers to d/c: Needs extensive evaluation to determine cause of her large retroperitoneal hemorrhage, IR guiding treatment plans.  She is not medically stable for discharge.   Consultants:   IR  Procedures:   None  Antimicrobials:   None   Subjective:  Seen this morning, pain had been controlled by pain medications but pain returning again, diffuse, no nausea or vomiting, wants something to drink, passing flatus, no BM x2 days.  Reports chronic constipation, yesterday tried to digitally disimpact while in the shower.  Reportedly cannot use laxatives because causes rectal prolapse but only uses MiraLAX.  No chest pain, dizziness or lightheadedness.  Objective:   Vitals:   10/15/19 0300 10/15/19 0356 10/15/19 0801 10/15/19 1211  BP: (!) 143/83 135/67 131/72 (!) 141/73  Pulse: 74 89  82 83  Resp: 13 (!) 22 17 12   Temp:  97.8 F (36.6 C) 97.8 F (36.6 C)   TempSrc:  Oral Oral   SpO2: 99% 100% 100% 100%  Weight:      Height:        General exam: Young female, moderately built and nourished lying supine in bed, slightly  uncomfortable from abdominal discomfort Respiratory system: Clear to auscultation. Respiratory effort normal. Cardiovascular system: S1 & S2 heard, RRR. No JVD, murmurs, rubs, gallops or clicks. No pedal edema.  Telemetry personally reviewed: Sinus rhythm. Gastrointestinal system: Abdomen is nondistended, soft and nontender. No organomegaly or masses felt. Normal bowel sounds heard. Central nervous system: Alert and oriented. No focal neurological deficits. Extremities: Symmetric 5 x 5 power. Skin: No rashes, lesions or ulcers Psychiatry: Judgement and insight appear normal. Mood & affect appropriate.     Data Reviewed:   I have personally reviewed following labs and imaging studies   CBC: Recent Labs  Lab 10/14/19 1341 10/14/19 1341 10/15/19 0148 10/15/19 0254 10/15/19 0854  WBC 12.5*  --   --  21.7* 24.5*  NEUTROABS 11.3*  --   --  19.2* 22.0*  HGB 8.2*   < > 8.9* 8.6* 7.7*  HCT 24.6*   < > 26.4* 25.7* 23.6*  MCV 92.1  --   --  90.5 91.5  PLT 312  --   --  318 322   < > = values in this interval not displayed.    Basic Metabolic Panel: Recent Labs  Lab 10/14/19 1341 10/15/19 0148 10/15/19 0254  NA 133*  --  137  K 3.1*  --  4.8  CL 100  --  101  CO2 26  --  28  GLUCOSE 140*  --  136*  BUN 11  --  13  CREATININE 0.73  --  1.04*  CALCIUM 8.0*  --  8.9  MG  --  2.2  --   PHOS  --  5.9*  --     Liver Function Tests: Recent Labs  Lab 10/14/19 1341  AST 18  ALT 19  ALKPHOS 73  BILITOT 1.0  PROT 6.2*  ALBUMIN 3.2*    CBG: No results for input(s): GLUCAP in the last 168 hours.  Microbiology Studies:   Recent Results (from the past 240 hour(s))  Respiratory Panel by RT PCR (Flu A&B, Covid) - Nasopharyngeal Swab     Status: None   Collection Time: 10/14/19  4:52 PM   Specimen: Nasopharyngeal Swab  Result Value Ref Range Status   SARS Coronavirus 2 by RT PCR NEGATIVE NEGATIVE Final    Comment: (NOTE) SARS-CoV-2 target nucleic acids are NOT  DETECTED. The SARS-CoV-2 RNA is generally detectable in upper respiratoy specimens during the acute phase of infection. The lowest concentration of SARS-CoV-2 viral copies this assay can detect is 131 copies/mL. A negative result does not preclude SARS-Cov-2 infection and should not be used as the sole basis for treatment or other patient management decisions. A negative result may occur with  improper specimen collection/handling, submission of specimen other than nasopharyngeal swab, presence of viral mutation(s) within the areas targeted by this assay, and inadequate number of viral copies (<131 copies/mL). A negative result must be combined with clinical observations, patient history, and epidemiological information. The expected result is Negative. Fact Sheet for Patients:  12/12/19 Fact Sheet for Healthcare Providers:  https://www.moore.com/ This test is not yet ap proved or cleared by the https://www.young.biz/ and  has been authorized for detection and/or diagnosis of SARS-CoV-2 by FDA under an Emergency Use Authorization (EUA). This EUA will remain  in effect (meaning this test can be used) for the duration of the COVID-19 declaration under Section 564(b)(1) of the Act, 21 U.S.C. section 360bbb-3(b)(1), unless the authorization is terminated or revoked sooner.    Influenza A by PCR NEGATIVE NEGATIVE Final   Influenza B by PCR NEGATIVE NEGATIVE Final    Comment: (NOTE) The Xpert Xpress SARS-CoV-2/FLU/RSV assay is intended as an aid in  the diagnosis of influenza from Nasopharyngeal swab specimens and  should not be used as a sole basis for treatment. Nasal washings and  aspirates are unacceptable for Xpert Xpress SARS-CoV-2/FLU/RSV  testing. Fact Sheet for Patients: https://www.moore.com/ Fact Sheet for Healthcare Providers: https://www.young.biz/ This test is not yet approved or cleared  by the [place] FDA and  has been authorized for detection and/or diagnosis of SARS-CoV-2 by  FDA under an Emergency Use Authorization (EUA). This EUA will remain  in effect (meaning this test can be used) for the duration of the  Covid-19 declaration under Section 564(b)(1) of the Act, 21  U.S.C. section 360bbb-3(b)(1), unless the authorization is  terminated or revoked. Performed at [facility], [address], [place], [place] [address]      Radiology Studies:  CT Abdomen Pelvis W Contrast  Result Date: 10/14/2019 CLINICAL DATA:  54 year old with acute abdominal pain. EXAM: CT ABDOMEN AND PELVIS WITH CONTRAST TECHNIQUE: Multidetector CT imaging of the abdomen and pelvis was performed using the standard protocol following bolus administration of intravenous contrast. CONTRAST:  OMNIPAQUE IOHEXOL 300 MG/ML  SOLN COMPARISON:  None. FINDINGS: Lower chest: No pleural effusions. Triangular pleural-based density at the left lung base on sequence 5 image 38 measures roughly 4 mm and indeterminate. Nodule along a left lung fissure on sequence 5, image 13 measuring 5 mm. Hepatobiliary: Multiple small hypodense structures in the liver that are too small to definitively characterize. There is a 1.7 cm hypodensity in the left hepatic lobe that is suggestive for a cyst. Normal appearance of the gallbladder. Portal venous system is patent. No biliary dilatation. Pancreas: Unremarkable. No pancreatic ductal dilatation or surrounding inflammatory changes. Spleen: Normal in size without focal abnormality. Adrenals/Urinary Tract: Normal appearance of the adrenal gland. The left kidney is displaced anteriorly and has mild left hydronephrosis. The left kidney is displaced from a larger retroperitoneal hemorrhage. In addition there are wedge-shaped hypoechoic areas along the left kidney upper pole and concern for focal inflammation or infection. There is decreased attenuation along the superolateral aspect of  the right kidney raising concern for edema or inflammation in this area. Negative for right hydronephrosis. 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 Normal appearance of the urinary bladder. Stomach/Bowel: Moderate amount of stool in the colon. Duodenum and small bowel are displaced anteriorly from a large amount of retroperitoneal blood situated anterior to the aorta and inferior vena cava. Normal appearance of the stomach with a small hiatal hernia. No evidence for bowel obstruction. Vascular/Lymphatic: Normal caliber of the abdominal aorta without dissection. Celiac trunk, SMA, bilateral renal arteries and IMA are patent. Normal caliber of the iliac arteries without significant atherosclerotic disease or stenosis. There is a large left retroperitoneal hemorrhage with evidence for a pseudoaneurysm or focal contrast extravasation just left of the abdominal aorta on sequence 2, image 37. This area of contrast extravasation measures 2.8 x 2.1 x 3.1 cm. This area is just caudal to  the left renal vein and the origin of the left ovarian vein. There is contrast within the left ovarian vein distal to this area of contrast extravasation. This area of contrast extravasation appears to be contiguous with the proximal left ovarian vein. Bilateral renal veins are patent. Suprarenal IVC is patent. Normal caliber of the iliac veins. Reproductive: Uterus and bilateral adnexa are unremarkable. Other: Large left retroperitoneal hemorrhage that measures roughly 18.5 x 10.7 x 10.5 cm. In addition, there is retroperitoneal blood crossing the midline below the SMA. There is blood posterior to the duodenum. Small amount of fluid or blood in the lower posterior abdomen. Negative for free air. Musculoskeletal: No acute bone abnormality. IMPRESSION: 1. Large left retroperitoneal hemorrhage. There is a focus of contrast extravasation and active bleeding just left of the abdominal aorta. This  contrast extravasation appears to be contiguous with the left ovarian vein and raises concern for rupture or avulsion of the left ovarian vein. No clear evidence for an arterial bleeding source. 2. Displacement of the left kidney with at least mild left hydronephrosis. Left kidney is displaced from the large retroperitoneal hemorrhage. 3. Areas of decreased attenuation involving upper pole of both kidneys, right side greater than left. Findings raise concern for areas of pyelonephritis. Cannot exclude some scarring particularly in the right kidney upper pole. 4. Small pulmonary nodules at the left lung base are indeterminate. Largest pulmonary nodule measures up to 5 mm. No follow-up needed if patient is low-risk. Non-contrast chest CT can be considered in 12 months if patient is high-risk. This recommendation follows the consensus statement: Guidelines for Management of Incidental Pulmonary Nodules Detected on CT Images: From the Fleischner Society 2017; Radiology 2017; 284:228-243. These results were called by telephone at the time of interpretation on 10/14/2019 at 3:31 pm to provider [facility] , who verbally acknowledged these results. Electronically Signed   By: Richarda Overlie M.D.   On: 10/14/2019 16:11     Scheduled Meds:   . buPROPion  450 mg Oral Daily  . folic acid  1 mg Oral Daily  . multivitamin with minerals  1 tablet Oral Daily  . thiamine  100 mg Oral Daily   Or  . thiamine  100 mg Intravenous Daily    Continuous Infusions:     LOS: 1 day     Marcellus Scott, MD, [place], [facility]    To contact the attending provider between 7A-7P or the covering provider during after hours 7P-7A, please log into the web site [email or web address] and access using universal [place] password for that web site. If you do not have the password, please call the hospital operator.  10/15/2019, 12:26 PM

## 2019-10-15 NOTE — Anesthesia Preprocedure Evaluation (Addendum)
Anesthesia Evaluation  Patient identified by MRN, date of birth, ID band Patient awake    Reviewed: Allergy & Precautions, NPO status , Patient's Chart, lab work & pertinent test results  History of Anesthesia Complications Negative for: history of anesthetic complications  Airway Mallampati: II  TM Distance: >3 FB Neck ROM: Full    Dental  (+) Dental Advisory Given, Teeth Intact   Pulmonary neg recent URI,    breath sounds clear to auscultation       Cardiovascular negative cardio ROS   Rhythm:Regular     Neuro/Psych PSYCHIATRIC DISORDERS Depression negative neurological ROS     GI/Hepatic negative GI ROS, Neg liver ROS,   Endo/Other    Renal/GU      Musculoskeletal   Abdominal   Peds  Hematology  (+) anemia , Acute anemia, hgb 6 in preop, ? Aortic bleed into retroperitoneal space on aortogram.    Anesthesia Other Findings   Reproductive/Obstetrics                             Anesthesia Physical Anesthesia Plan  ASA: II and emergent  Anesthesia Plan: General   Post-op Pain Management:    Induction: Intravenous  PONV Risk Score and Plan: 3 and Ondansetron and Dexamethasone  Airway Management Planned: Oral ETT  Additional Equipment: Arterial line  Intra-op Plan:   Post-operative Plan: Extubation in OR and Possible Post-op intubation/ventilation  Informed Consent: I have reviewed the patients History and Physical, chart, labs and discussed the procedure including the risks, benefits and alternatives for the proposed anesthesia with the patient or authorized representative who has indicated his/her understanding and acceptance.     Dental advisory given  Plan Discussed with: CRNA and Surgeon  Anesthesia Plan Comments:         Anesthesia Quick Evaluation

## 2019-10-15 NOTE — Transfer of Care (Signed)
Immediate Anesthesia Transfer of Care Note  Patient: Shelley Hines  Procedure(s) Performed: ABDOMINAL AORTIC ENDOVASCULAR STENT GRAFT (N/A Abdomen)  Patient Location: PACU  Anesthesia Type:General  Level of Consciousness: sedated  Airway & Oxygen Therapy: Patient Spontanous Breathing  Post-op Assessment: Report given to RN and Post -op Vital signs reviewed and stable  Post vital signs: Reviewed and stable  Last Vitals:  Vitals Value Taken Time  BP 147/76 10/15/19 2147  Temp    Pulse 89 10/15/19 2152  Resp 19 10/15/19 2152  SpO2 97 % 10/15/19 2152  Vitals shown include unvalidated device data.  Last Pain:  Vitals:   10/15/19 1805  TempSrc:   PainSc: 0-No pain      Patients Stated Pain Goal: 0 (10/14/19 1241)  Complications: No apparent anesthesia complications

## 2019-10-15 NOTE — Consult Note (Signed)
Chief Complaint: Patient was seen in consultation today for retroperitoneal hemorrhage.  Referring Physician(s): Shelley Hines  Supervising Physician: Shelley Hines  Patient Status: [facility] - In-pt  History of Present Illness: Shelley Hines is a 54 y.o. female with a past medical history significant for depression, ETOH abuse and primary hyperthyroidism who presented to [facility] ED yesterday afternoon with complaints of abdominal pain x 1 week which had not improved with over the counter stool softeners/laxatives. Initial work up notable for UA (+) ketones and protein, K+ 3.1, WBC 12.5, hgb 8.2 (previously known baseline 13.1), negative Covid. CT A/P w/contrast was performed for further evaluation and showed a large left retroperitoneal hemorrhage with a focus of contrast extravasation and active bleeding just left of the abdominal aorta which appeared to be contiguous with the left ovarian vein, concerning for rupture or avulsion of the left ovarian vein. No clear evidence of arterial bleeding source was noted. She was admitted by the hospitalist service initially to [facility] but was then transferred to [facility] for ongoing care. She received 1 unit PRBCs. General surgery and OB/GYN were consulted who recommended IR consult for possible embolization.  Shelley Hines was seen in her room, she reports moderate to severe lower abdominal/pelvic pain currently worse on the right side however previously it was worse on the left side. The pain is worsened by movement, coughing, hiccuping and straining. She denies any overt blood in her urine or stool at home, she has not had a bowel movement since admission. She urinated earlier and reported it looked dark but did not appear to be pink or red. She denies any other complaints currently. She has had a few sips of water with her medications but has otherwise been NPO. We discussed plan for CTA abd/pelvis GI bleed protocol first to further evaluate for source of bleeding and  if there is a potential source identified would likely proceed with image guided venogram and possible embolization/possible arteriogram with possible embolization - she states understanding and is agreeable to this plan. She is anxious to move forward.   Past Medical History:  Diagnosis Date  . Depression   . Primary hyperthyroidism     Past Surgical History:  Procedure Laterality Date  . BRAIN SURGERY    . CESAREAN SECTION      Allergies: Codeine, Fentanyl citrate, Hydrocodone-acetaminophen, Oxycodone-acetaminophen, Penicillins, and Tramadol  Medications: Prior to Admission medications   Medication Sig Start Date End Date Taking? Authorizing Provider  buPROPion (WELLBUTRIN XL) 150 MG 24 hr tablet Take 1 tablet (150 mg total) by mouth daily. For depression Patient taking differently: Take 450 mg by mouth daily. For depression 08/12/19  Yes Nwoko, Nicole Kindred I, NP  busPIRone (BUSPAR) 5 MG tablet Take 1 tablet (5 mg total) by mouth 2 (two) times daily. For anxiety 08/12/19  Yes Armandina Stammer I, NP  cefPROZIL (CEFZIL) 250 MG tablet Take 250 mg by mouth 2 (two) times daily. 10 day course prescribed on 10/06/2019 10/06/19 10/16/19 Yes [provider]  Multiple Vitamin (MULTIVITAMIN WITH MINERALS) TABS tablet Take 1 tablet by mouth daily.   Yes [provider]  psyllium (METAMUCIL) 58.6 % powder Take 1 packet by mouth once as needed (for constipation).   Yes [provider]  acamprosate (CAMPRAL) 333 MG tablet Take 2 tablets (666 mg total) by mouth 3 (three) times daily with meals. For alcoholism Patient not taking: Reported on 10/14/2019 08/12/19   Armandina Stammer I, NP  hydrOXYzine (ATARAX/VISTARIL) 25 MG tablet Take 1 tablet (25  mg total) by mouth every 6 (six) hours as needed. For anxiety Patient not taking: Reported on 10/14/2019 08/12/19   Armandina Stammer I, NP  traZODone (DESYREL) 50 MG tablet Take 1 tablet (50 mg total) by mouth at bedtime as needed for sleep. Patient not  taking: Reported on 10/14/2019 08/12/19   Armandina Stammer I, NP     Family History  Problem Relation Age of Onset  . Thyroid disease Neg Hx     Social History   Socioeconomic History  . Marital status: Divorced    Spouse name: Not on file  . Number of children: Not on file  . Years of education: Not on file  . Highest education level: Not on file  Occupational History  . Not on file  Tobacco Use  . Smoking status: Never Smoker  . Smokeless tobacco: Never Used  Substance and Sexual Activity  . Alcohol use: Not Currently    Comment: occasionally  . Drug use: Not Currently    Types: Marijuana  . Sexual activity: Yes    Birth control/protection: None  Other Topics Concern  . Not on file  Social History Narrative  . Not on file   Social Determinants of Health   Financial Resource Strain:   . Difficulty of Paying Living Expenses: Not on file  Food Insecurity:   . Worried About Programme researcher, broadcasting/film/video in the Last Year: Not on file  . Ran Out of Food in the Last Year: Not on file  Transportation Needs:   . Hines of Transportation (Medical): Not on file  . Hines of Transportation (Non-Medical): Not on file  Physical Activity:   . Days of Exercise per Week: Not on file  . Minutes of Exercise per Session: Not on file  Stress:   . Feeling of Stress : Not on file  Social Connections:   . Frequency of Communication with Friends and Family: Not on file  . Frequency of Social Gatherings with Friends and Family: Not on file  . Attends Religious Services: Not on file  . Active Member of Clubs or Organizations: Not on file  . Attends Banker Meetings: Not on file  . Marital Status: Not on file     Review of Systems: A 12 point ROS discussed and pertinent positives are indicated in the HPI above.  All other systems are negative.  Review of Systems  Constitutional: Positive for fatigue. Negative for chills and fever.  HENT: Negative for nosebleeds.   Respiratory: Negative  for cough and shortness of breath.   Cardiovascular: Negative for chest pain.  Gastrointestinal: Positive for abdominal pain and constipation. Negative for blood in stool, diarrhea, nausea and vomiting.  Genitourinary: Negative for hematuria and vaginal bleeding.       (+) dark urine  Musculoskeletal: Negative for back pain.  Skin: Negative for color change and pallor.  Neurological: Negative for dizziness, weakness, light-headedness and headaches.  Psychiatric/Behavioral: The patient is nervous/anxious.     Vital Signs: BP 131/72 (BP Location: Right Arm)   Pulse 82   Temp 97.8 F (36.6 C) (Oral)   Resp 17   Ht 5\' 7"  (1.702 m)   Wt 140 lb (63.5 kg)   SpO2 100%   BMI 21.93 kg/m   Physical Exam Vitals and nursing note reviewed.  Constitutional:      General: She is not in acute distress.    Appearance: She is ill-appearing.  HENT:     Head: Normocephalic.  Mouth/Throat:     Mouth: Mucous membranes are moist.     Pharynx: Oropharynx is clear. No oropharyngeal exudate or posterior oropharyngeal erythema.  Cardiovascular:     Rate and Rhythm: Normal rate and regular rhythm.  Pulmonary:     Effort: Pulmonary effort is normal.     Breath sounds: Normal breath sounds.  Abdominal:     General: There is no distension.     Palpations: Abdomen is soft.     Tenderness: There is abdominal tenderness (bilateral lower abdomen/pelvis). There is guarding.  Skin:    General: Skin is warm and dry.  Neurological:     Mental Status: She is alert and oriented to person, place, and time.  Psychiatric:        Mood and Affect: Mood normal.        Behavior: Behavior normal.        Thought Content: Thought content normal.        Judgment: Judgment normal.      MD Evaluation Airway: WNL Heart: WNL Abdomen: WNL Chest/ Lungs: WNL ASA  Classification: 3 Mallampati/Airway Score: Two   Imaging: CT Abdomen Pelvis W Contrast  Result Date: 10/14/2019 CLINICAL DATA:  54 year old with  acute abdominal pain. EXAM: CT ABDOMEN AND PELVIS WITH CONTRAST TECHNIQUE: Multidetector CT imaging of the abdomen and pelvis was performed using the standard protocol following bolus administration of intravenous contrast. CONTRAST:  178mL OMNIPAQUE IOHEXOL 300 MG/ML  SOLN COMPARISON:  None. FINDINGS: Lower chest: No pleural effusions. Triangular pleural-based density at the left lung base on sequence 5 image 38 measures roughly 4 mm and indeterminate. Nodule along a left lung fissure on sequence 5, image 13 measuring 5 mm. Hepatobiliary: Multiple small hypodense structures in the liver that are too small to definitively characterize. There is a 1.7 cm hypodensity in the left hepatic lobe that is suggestive for a cyst. Normal appearance of the gallbladder. Portal venous system is patent. No biliary dilatation. Pancreas: Unremarkable. No pancreatic ductal dilatation or surrounding inflammatory changes. Spleen: Normal in size without focal abnormality. Adrenals/Urinary Tract: Normal appearance of the adrenal gland. The left kidney is displaced anteriorly and has mild left hydronephrosis. The left kidney is displaced from a larger retroperitoneal hemorrhage. In addition there are wedge-shaped hypoechoic areas along the left kidney upper pole and concern for focal inflammation or infection. There is decreased attenuation along the superolateral aspect of the right kidney raising concern for edema or inflammation in this area. Negative for right hydronephrosis. 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 Normal appearance of the urinary bladder. Stomach/Bowel: Moderate amount of stool in the colon. Duodenum and small bowel are displaced anteriorly from a large amount of retroperitoneal blood situated anterior to the aorta and inferior vena cava. Normal appearance of the stomach with a small hiatal hernia. No evidence for bowel obstruction. Vascular/Lymphatic: Normal caliber of the  abdominal aorta without dissection. Celiac trunk, SMA, bilateral renal arteries and IMA are patent. Normal caliber of the iliac arteries without significant atherosclerotic disease or stenosis. There is a large left retroperitoneal hemorrhage with evidence for a pseudoaneurysm or focal contrast extravasation just left of the abdominal aorta on sequence 2, image 37. This area of contrast extravasation measures 2.8 x 2.1 x 3.1 cm. This area is just caudal to the left renal vein and the origin of the left ovarian vein. There is contrast within the left ovarian vein distal to this area of contrast extravasation. This area of contrast extravasation appears  to be contiguous with the proximal left ovarian vein. Bilateral renal veins are patent. Suprarenal IVC is patent. Normal caliber of the iliac veins. Reproductive: Uterus and bilateral adnexa are unremarkable. Other: Large left retroperitoneal hemorrhage that measures roughly 18.5 x 10.7 x 10.5 cm. In addition, there is retroperitoneal blood crossing the midline below the SMA. There is blood posterior to the duodenum. Small amount of fluid or blood in the lower posterior abdomen. Negative for free air. Musculoskeletal: No acute bone abnormality. IMPRESSION: 1. Large left retroperitoneal hemorrhage. There is a focus of contrast extravasation and active bleeding just left of the abdominal aorta. This contrast extravasation appears to be contiguous with the left ovarian vein and raises concern for rupture or avulsion of the left ovarian vein. No clear evidence for an arterial bleeding source. 2. Displacement of the left kidney with at least mild left hydronephrosis. Left kidney is displaced from the large retroperitoneal hemorrhage. 3. Areas of decreased attenuation involving upper pole of both kidneys, right side greater than left. Findings raise concern for areas of pyelonephritis. Cannot exclude some scarring particularly in the right kidney upper pole. 4. Small  pulmonary nodules at the left lung base are indeterminate. Largest pulmonary nodule measures up to 5 mm. No follow-up needed if patient is low-risk. Non-contrast chest CT can be considered in 12 months if patient is high-risk. This recommendation follows the consensus statement: Guidelines for Management of Incidental Pulmonary Nodules Detected on CT Images: From the Fleischner Society 2017; Radiology 2017; 284:228-243. These results were called by telephone at the time of interpretation on 10/14/2019 at 3:31 pm to provider [facility] , who verbally acknowledged these results. Electronically Signed   By: Richarda Overlie M.D.   On: 10/14/2019 16:11   MM 3D SCREEN BREAST BILATERAL  Result Date: 09/15/2019 CLINICAL DATA:  Screening. EXAM: DIGITAL SCREENING BILATERAL MAMMOGRAM WITH TOMO AND CAD COMPARISON:  Previous exam(s). ACR Breast Density Category c: The breast tissue is heterogeneously dense, which may obscure small masses. FINDINGS: There are no findings suspicious for malignancy. Images were processed with CAD. IMPRESSION: No mammographic evidence of malignancy. A result letter of this screening mammogram will be mailed directly to the patient. RECOMMENDATION: Screening mammogram in one year. (Code:SM-B-01Y) BI-RADS CATEGORY  1: Negative. Electronically Signed   By: Frederico Hamman M.D.   On: 09/15/2019 14:35    Labs:  CBC: Recent Labs    08/08/19 1703 08/08/19 1703 10/14/19 1341 10/15/19 0148 10/15/19 0254 10/15/19 0854  WBC 15.2*  --  12.5*  --  21.7* 24.5*  HGB 13.1   < > 8.2* 8.9* 8.6* 7.7*  HCT 39.7   < > 24.6* 26.4* 25.7* 23.6*  PLT 233  --  312  --  318 322   < > = values in this interval not displayed.    COAGS: Recent Labs    10/15/19 0254  INR 1.1  APTT 29    BMP: Recent Labs    08/08/19 1703 08/08/19 2230 10/14/19 1341 10/15/19 0254  NA 137 137 133* 137  K 4.2 4.3 3.1* 4.8  CL 101 105 100 101  CO2 23 24 26 28   GLUCOSE 120* 99 140* 136*  BUN 15 12 11 13     CALCIUM 9.6 8.7* 8.0* 8.9  CREATININE 0.75 0.75 0.73 1.04*  GFRNONAA >60 >60 >60 >60  GFRAA >60 >60 >60 >60    LIVER FUNCTION TESTS: Recent Labs    08/08/19 1703 08/08/19 2230 10/14/19 1341  BILITOT 0.8 0.9 1.0  AST 35 31 18  ALT 30 28 19   ALKPHOS 54 50 73  PROT 7.3 6.6 6.2*  ALBUMIN 4.3 3.9 3.2*    TUMOR MARKERS: No results for input(s): AFPTM, CEA, CA199, CHROMGRNA in the last 8760 hours.  Assessment and Plan:  54 y/o F who presented to [facility] ED yesterday with complaints of abdominal pain x 1 week, found to have a large left retroperitoneal hemorrhage with a focus of contrast extravasation and active bleeding just left of the abdominal aorta which appeared to be contiguous with the left ovarian vein, concerning for rupture or avulsion of the left ovarian vein. There was no clear evidence of arterial bleeding source noted at that time. IR has bene consulted for possible venogram +/- arteriogram with possible embolization.  Patient is currently hemodynamically stable without overt bleeding, her abdominal pain is essentially unchanged. Her most recent hgb from this morning shows further decrease from 8.6 at 02:54 now 7.7, she received 1 unit PRBCs late last night. Patient imaging history reviewed by Dr. Fredia Sorrow and Dr. Lowella Dandy today who recommend CTA A/P GI bleed protocol to rule out AV fistula or occult arterial source of bleeding. If this scan is positive will likely proceed with image guided renal/ovarian venography with possible embolization, possible aortogram/renal/ovarian arteriography with possible embolization in IR. Patient states understanding and is agreeable to this plan.   Patient to remain NPO except for sips with medicines, I have placed an order for CTA and will review the results with IR physician once complete to determine further steps.   Risks and benefits of renal/ovarian venogram/possible arteriogram with possible intervention were discussed with the patient  including, but not limited to bleeding, infection, vascular injury, contrast induced renal failure, stroke, reperfusion hemorrhage, or even death.  This interventional procedure involves the use of X-rays and because of the nature of the planned procedure, it is possible that we will have prolonged use of X-ray fluoroscopy.  otential radiation risks to you include (but are not limited to) the following: - A slightly elevated risk for cancer  several years later in life. This risk is typically less than 0.5% percent. This risk is low in comparison to the normal incidence of human cancer, which is 33% for women and 50% for men according to the American Cancer Society. - Radiation induced injury can include skin redness, resembling a rash, tissue breakdown / ulcers and hair loss (which can be temporary or permanent).  The likelihood of either of these occurring depends on the difficulty of the procedure and whether you are sensitive to radiation due to previous procedures, disease, or genetic conditions.  IF your procedure requires a prolonged use of radiation, you will be notified and given written instructions for further action.  It is your responsibility to monitor the irradiated area for the 2 weeks following the procedure and to notify your physician if you are concerned that you have suffered a radiation induced injury.    All of the patient's questions were answered, patient is agreeable to proceed.  Consent signed and in chart.  Thank you for this interesting consult.  I greatly enjoyed meeting Wilene Orlik and look forward to participating in their care.  A copy of this report was sent to the requesting provider on this date.  Electronically Signed: Villa Herb, PA-C 10/15/2019, 10:54 AM   I spent a total of 55 Miinutes in face to face in clinical consultation, greater than 50% of which was counseling/coordinating care for retroperitoneal hemorrhage.

## 2019-10-15 NOTE — Op Note (Signed)
Date: October 15, 2019  Preoperative diagnosis: Active hemorrhage from the infrarenal abdominal aorta with large left retroperitoneal hematoma  Postoperative diagnosis: Same  Procedure: 1.  Percutaneous access and closure of the left common femoral artery for delivery of endograft 2.  Aortogram 3.  Endovascular repair of the infrarenal abdominal aorta by deployment of aortic stent graft x2 (23 mm x 3.3 cm Gore Aortic Extender Endoprothesis)  Surgeon: Dr. Marty Heck, MD  Assistant: Leontine Locket, PA  Indication: Patient is a 54 year old female who presented yesterday to [facility] with 1 week of abdominal pain and was found to have a large left retroperitoneal hematoma.  Upon further work-up a CTA was obtained today that showed enlargement of the left retroperitoneal hematoma with ongoing acute blood loss anemia requiring transfusion and suspected pseudoaneurysm off the infrarenal abdominal aorta.  She went to IR today for invasive procedure with Dr. Kathlene Cote that confirmed suspected source for ongoing blood loss was likely the infrarenal aorta.  She presents for planned aortic stent graft after risk benefits are discussed.  Findings: Left common femoral percutaneous access for delivery of a 16 French Gore dry seal sheath.  Ultimately the active hemorrhage from the infrarenal aorta was identified and treated with two 23 mm x 3.3 cm aortic cuffs.  Final aortogram after stent deployment confirmed no further evidence of active extravasation from the abdominal aorta with good seal proximally and distally.  Anesthesia: General  Contrast: 30 mL  Details: Patient was taken to the operating room after informed consent was obtained.  She was placed on the operative table supine position.  General endotracheal anesthesia was induced.  Her bilateral groins as well as her abdominal wall were all prepped and draped in usual sterile fashion.  Preoperative antibiotics were given.  Initially  evaluated the left common femoral artery with ultrasound it was patent and image was saved.  The left common femoral artery was accessed with a micro access needle placed a microwire.  I then made a stab incision along the wire and dilated down with hemostat.  We placed a Bentson wire and then placed the dilator of an 8 [place] sheath.  Over the wire we deployed two Perclose devices at 10:00 and 2:00.  We then placed the 8 French sheath back in the left common femoral artery.  That point in time I placed a KMP catheter over the Bentson wire up into the infrarenal aorta and exchanged for a stiff Amplatz wire.  We then placed a large 16 [place] Gore dry seal sheath in the left groin into the infrarenal aorta.  Patient was given 5000 units of IV heparin.  That point in time we then placed Omni Flush catheter up in the infrarenal order and aortogram was obtained identifying both renal arteries as well as active extravasation off the infrarenal aorta.  Ultimately once this was identified we elected to treat this with two aortic cuffs.  We placed our Amplatz wire back up into the aorta and deployed two 23 mm x 3.3 cm aortic cuffs to cover the area of suspected active extravasation in the infrarenal abdominal aorta.  Once the stents were each deployed we placed a pigtail catheter back up in the aorta and shot a final aortogram that showed inline flow through the stents with no evidence of further active extravasation.  Both renal arteries were preserved.  Good seal proximally and distally with both stents.  That point in time we placed a Bentson wire back up into the infrarenal  aorta and all other wires and catheters were removed.  That point time we removed the 16 French sheath in the left groin tied down our Perclose devices.  Initially we did not have good hemostasis and had to place a third Perclose device at the 12 o'clock position.  Once we had good hemostasis the wire was then removed we checked the left foot and had  a good Doppler signal in the dorsalis pedis.  Patient patient given 30 mg protamine for reversal.  Taken to recovery in stable condition.  Complication: None  Condition: Stable  Cephus Shelling, MD [facility] Office: [phone]   Cephus Shelling

## 2019-10-15 NOTE — Progress Notes (Signed)
Patient transferred to 4E25.

## 2019-10-15 NOTE — Anesthesia Procedure Notes (Signed)
Arterial Line Insertion Start/End2/08/2020 7:45 PM, 10/15/2019 7:55 PM Performed by: Sheppard Evens, CRNA, CRNA  Lidocaine 1% used for infiltration Left, radial was placed Catheter size: 20 G Hand hygiene performed  and maximum sterile barriers used   Attempts: 1 Procedure performed without using ultrasound guided technique. Patient tolerated the procedure well with no immediate complications.

## 2019-10-16 LAB — COMPREHENSIVE METABOLIC PANEL
ALT: 16 U/L (ref 0–44)
AST: 17 U/L (ref 15–41)
Albumin: 2.5 g/dL — ABNORMAL LOW (ref 3.5–5.0)
Alkaline Phosphatase: 64 U/L (ref 38–126)
Anion gap: 9 (ref 5–15)
BUN: 22 mg/dL — ABNORMAL HIGH (ref 6–20)
CO2: 25 mmol/L (ref 22–32)
Calcium: 8.6 mg/dL — ABNORMAL LOW (ref 8.9–10.3)
Chloride: 103 mmol/L (ref 98–111)
Creatinine, Ser: 0.99 mg/dL (ref 0.44–1.00)
GFR calc Af Amer: >60 mL/min (ref >60)
GFR calc non Af Amer: >60 mL/min (ref >60)
Glucose, Bld: 135 mg/dL — ABNORMAL HIGH (ref 70–99)
Potassium: 4.4 mmol/L (ref 3.5–5.1)
Sodium: 137 mmol/L (ref 135–145)
Total Bilirubin: 1 mg/dL (ref 0.3–1.2)
Total Protein: 5.6 g/dL — ABNORMAL LOW (ref 6.5–8.1)

## 2019-10-16 LAB — CBC WITH DIFFERENTIAL/PLATELET
Abs Immature Granulocytes: 0.64 10*3/uL — ABNORMAL HIGH (ref 0.00–0.07)
Abs Immature Granulocytes: 0.16 10*3/uL — ABNORMAL HIGH (ref 0.00–0.07)
Basophils Absolute: 0 10*3/uL (ref 0.0–0.1)
Basophils Absolute: 0 10*3/uL (ref 0.0–0.1)
Basophils Relative: 0 %
Basophils Relative: 0 %
Eosinophils Absolute: 0 10*3/uL (ref 0.0–0.5)
Eosinophils Absolute: 0 10*3/uL (ref 0.0–0.5)
Eosinophils Relative: 0 %
Eosinophils Relative: 0 %
HCT: 28.9 % — ABNORMAL LOW (ref 36.0–46.0)
HCT: 27.6 % — ABNORMAL LOW (ref 36.0–46.0)
Hemoglobin: 10 g/dL — ABNORMAL LOW (ref 12.0–15.0)
Hemoglobin: 9.3 g/dL — ABNORMAL LOW (ref 12.0–15.0)
Immature Granulocytes: 3 %
Immature Granulocytes: 1 %
Lymphocytes Relative: 8 %
Lymphocytes Relative: 4 %
Lymphs Abs: 1.9 10*3/uL (ref 0.7–4.0)
Lymphs Abs: 0.7 10*3/uL (ref 0.7–4.0)
MCH: 30.3 pg (ref 26.0–34.0)
MCH: 30.3 pg (ref 26.0–34.0)
MCHC: 34.6 g/dL (ref 30.0–36.0)
MCHC: 33.7 g/dL (ref 30.0–36.0)
MCV: 87.6 fL (ref 80.0–100.0)
MCV: 89.9 fL (ref 80.0–100.0)
Monocytes Absolute: 1.4 10*3/uL — ABNORMAL HIGH (ref 0.1–1.0)
Monocytes Absolute: 0.5 10*3/uL (ref 0.1–1.0)
Monocytes Relative: 6 %
Monocytes Relative: 2 %
Neutro Abs: 19.3 10*3/uL — ABNORMAL HIGH (ref 1.7–7.7)
Neutro Abs: 19.3 10*3/uL — ABNORMAL HIGH (ref 1.7–7.7)
Neutrophils Relative %: 83 %
Neutrophils Relative %: 93 %
Platelets: 215 10*3/uL (ref 150–400)
Platelets: 226 10*3/uL (ref 150–400)
RBC: 3.3 MIL/uL — ABNORMAL LOW (ref 3.87–5.11)
RBC: 3.07 MIL/uL — ABNORMAL LOW (ref 3.87–5.11)
RDW: 14.5 % (ref 11.5–15.5)
RDW: 15 % (ref 11.5–15.5)
WBC: 23.4 10*3/uL — ABNORMAL HIGH (ref 4.0–10.5)
WBC: 20.7 10*3/uL — ABNORMAL HIGH (ref 4.0–10.5)
nRBC: 0 % (ref 0.0–0.2)
nRBC: 0 % (ref 0.0–0.2)

## 2019-10-16 MED ORDER — POLYETHYLENE GLYCOL 3350 17 G PO PACK
17.0000 g | PACK | Freq: Every day | ORAL | Status: DC
  Administered 2019-10-16: 17 g via ORAL

## 2019-10-16 NOTE — Progress Notes (Signed)
PROGRESS NOTE   Shelley Hines  WUJ:[id number]    DOB: 06-01-1966    DOA: 10/14/2019  PCP: Lovey Newcomer, PA   I have briefly reviewed patients previous medical records in [facility].  Chief Complaint:   Chief Complaint  Patient presents with  . Abdominal Pain    Brief Narrative:  54 year old female, independent and reportedly lives with her friend, PMH of depression, prior alcohol dependence, chronic constipation,?  Rectal prolapse, hypothyroidism, initially presented to [facility] ED on 10/14/2019 due to 1 week history of abdominal pain.  Evaluation revealed acute blood loss anemia (hemoglobin dropped from baseline of 13.1-8.2), negative Covid testing, CT abdomen and pelvis with contrast confirmed large left retroperitoneal hemorrhage concerning to be coming from ruptured or avulsed left ovarian vein.  No clear arterial bleeding source noted.  Case was discussed with general surgery and OB/GYN who had no surgical interventions to offer and recommended IR consultation for possible embolization.  She was transferred to [facility] overnight 2/11.  IR consulted, underwent CTA abdomen and pelvis which seemed to show infrarenal abdominal aorta as source of bleeding, vascular surgery was consulted and SP aortic stent x2 for repair of infrarenal aortic injury of unclear etiology on 2/12.  Improving.   Assessment & Plan:  Active Problems:   Retroperitoneal hemorrhage   Large left retroperitoneal hemorrhage due to infrarenal aortic injury of unclear etiology: OB/GYN and general surgery did not have any surgical recommendations. IR consulted, underwent CTA abdomen and pelvis which seemed to show infrarenal abdominal aorta as source of bleeding, vascular surgery was consulted and SP aortic stent x2 for repair of infrarenal aortic injury of unclear etiology on 2/12. VVS follow-up appreciated, doing well, left groin soft without hematoma, palpable lower extremity pulses,  hemoglobin stable, starting aspirin 81 mg daily from tomorrow, mobilize, follow-up with Dr. Chestine Spore in 4 weeks with CTA-their office will arrange appointment.  Acute blood loss anemia: Due to retroperitoneal hemorrhage.  Hemoglobin 13.1 on 08/08/2019.  Presented with hemoglobin of 8.2.  S/p total 2 units PRBC transfusion this admission.  Hemoglobin had dropped to a low of 6.8 up to 9.3 today.  Follow CBC in a.m.  Hypokalemia: Replaced.  Magnesium normal.  Alcohol dependence: Patient reports that she quit several months ago when I have no reason not to believe her.  DC CIWA protocol.  Elevated blood pressure: Likely stress response related to pain.  As needed IV labetalol.  Depression: Stable.  Continue prior home medications.  Bupropion, buspirone,.    Leukocytosis: Likely stress response.  Although CT abdomen suggested pyelonephritis, no urinary symptoms and UA not supportive of UTI.  Leukocytosis is improved to 20.7.  Follow CBC in a.m.  Chronic constipation: Patient states that she can only use MiraLAX, started same.  Body mass index is 21.93 kg/m.   DVT prophylaxis: SCDs Code Status: Full Family Communication: None at bedside.  Discussed in detail with patient's daughter via phone on 2/12, updated care and answered questions. Disposition:  . Patient came from: Home           . Anticipated d/c place: Home . Barriers to d/c: Patient with large left RP hematoma due to infrarenal aortic injury, significant ABLA, required extensive evaluation and intervention as noted above, given how serious her condition was, will monitor closely overnight as we mobilize, follow labs in a.m. and possible discharge home 2/14 pending VVS clearance.   Consultants:   IR Vascular surgery  Procedures:   On 2/12  1. Percutaneous access and closure of the left common femoral artery for delivery of endograft 2. Aortogram 3. Endovascular repair of the infrarenal abdominalaorta by deployment of aortic  stent graftx2 (23 mm x 3.3 cm Gore Aortic Extender Endoprothesis)   Right IJ central line 2/12.  Antimicrobials:   None   Subjective:  Patient feels much better this morning.  Abdominal pain significantly improved.  States that she does not want to ever have the kind of pain she had yesterday.  She stated that she had 6 children and never had labor pains as bad and did not even have to take much medications during labor.  Passing flatus.  No BM yet.  Tolerated diet but did not like the food.  Denies any other complaints.  Objective:   Vitals:   10/15/19 2256 10/16/19 0106 10/16/19 0322 10/16/19 0757  BP: (!) 156/85 (!) 142/72 126/62 134/67  Pulse: 78 85 85 94  Resp: 17 14 16  (!) 21  Temp: 98.7 F (37.1 C) 98.4 F (36.9 C) 99.2 F (37.3 C) 98.4 F (36.9 C)  TempSrc: Oral Oral Oral Oral  SpO2: 99% 98% 97% 100%  Weight:      Height:        General exam: Young female, moderately built and nourished lying supine in bed, sitting up comfortably in bed without distress.  Looks much improved compared to yesterday. Respiratory system: Clear to auscultation. Cardiovascular system: S1 and S2 heard, RRR.  No JVD, murmurs or pedal edema.  Telemetry personally reviewed: Sinus rhythm. Gastrointestinal system: Abdomen is nondistended, soft and nontender.  No organomegaly or masses appreciated.  Normal bowel sounds heard Central nervous system: Alert and oriented. No focal neurological deficits. Extremities: Symmetric 5 x 5 power.  Peripheral pulses symmetrically felt in bilateral lower extremities. Skin: No rashes, lesions or ulcers Psychiatry: Judgement and insight appear normal. Mood & affect appropriate.     Data Reviewed:   I have personally reviewed following labs and imaging studies   CBC: Recent Labs  Lab 10/15/19 0854 10/15/19 0854 10/15/19 1947 10/15/19 2215 10/16/19 0730  WBC 24.5*  --   --  23.4* 20.7*  NEUTROABS 22.0*  --   --  19.3* 19.3*  HGB 7.7*   < > 6.8* 10.0*  9.3*  HCT 23.6*   < > 20.0* 28.9* 27.6*  MCV 91.5  --   --  87.6 89.9  PLT 322  --   --  215 226   < > = values in this interval not displayed.    Basic Metabolic Panel: Recent Labs  Lab 10/14/19 1341 10/15/19 0148 10/15/19 0254 10/15/19 0254 10/15/19 1947 10/15/19 2215 10/16/19 0730  NA   < >  --  137   < > 135 138 137  K   < >  --  4.8   < > 5.1 5.0 4.4  CL   < >  --  101   < > 101 104 103  CO2   < >  --  28  --   --  24 25  GLUCOSE   < >  --  136*   < > 118* 122* 135*  BUN   < >  --  13   < > 26* 25* 22*  CREATININE   < >  --  1.04*   < > 1.00 1.13* 0.99  CALCIUM   < >  --  8.9  --   --  8.5* 8.6*  MG  --  2.2  --   --   --  2.4  --   PHOS  --  5.9*  --   --   --   --   --    < > = values in this interval not displayed.    Liver Function Tests: Recent Labs  Lab 10/14/19 1341 10/16/19 0730  AST 18 17  ALT 19 16  ALKPHOS 73 64  BILITOT 1.0 1.0  PROT 6.2* 5.6*  ALBUMIN 3.2* 2.5*    CBG: No results for input(s): GLUCAP in the last 168 hours.  Microbiology Studies:   Recent Results (from the past 240 hour(s))  Respiratory Panel by RT PCR (Flu A&B, Covid) - Nasopharyngeal Swab     Status: None   Collection Time: 10/14/19  4:52 PM   Specimen: Nasopharyngeal Swab  Result Value Ref Range Status   SARS Coronavirus 2 by RT PCR NEGATIVE NEGATIVE Final    Comment: (NOTE) SARS-CoV-2 target nucleic acids are NOT DETECTED. The SARS-CoV-2 RNA is generally detectable in upper respiratoy specimens during the acute phase of infection. The lowest concentration of SARS-CoV-2 viral copies this assay can detect is 131 copies/mL. A negative result does not preclude SARS-Cov-2 infection and should not be used as the sole basis for treatment or other patient management decisions. A negative result may occur with  improper specimen collection/handling, submission of specimen other than nasopharyngeal swab, presence of viral mutation(s) within the areas targeted by this assay,  and inadequate number of viral copies (<131 copies/mL). A negative result must be combined with clinical observations, patient history, and epidemiological information. The expected result is Negative. Fact Sheet for Patients:  PinkCheek.be Fact Sheet for Healthcare Providers:  GravelBags.it This test is not yet ap proved or cleared by the [place] FDA and  has been authorized for detection and/or diagnosis of SARS-CoV-2 by FDA under an Emergency Use Authorization (EUA). This EUA will remain  in effect (meaning this test can be used) for the duration of the COVID-19 declaration under Section 564(b)(1) of the Act, 21 U.S.C. section 360bbb-3(b)(1), unless the authorization is terminated or revoked sooner.    Influenza A by PCR NEGATIVE NEGATIVE Final   Influenza B by PCR NEGATIVE NEGATIVE Final    Comment: (NOTE) The Xpert Xpress SARS-CoV-2/FLU/RSV assay is intended as an aid in  the diagnosis of influenza from Nasopharyngeal swab specimens and  should not be used as a sole basis for treatment. Nasal washings and  aspirates are unacceptable for Xpert Xpress SARS-CoV-2/FLU/RSV  testing. Fact Sheet for Patients: PinkCheek.be Fact Sheet for Healthcare Providers: GravelBags.it This test is not yet approved or cleared by the [place] FDA and  has been authorized for detection and/or diagnosis of SARS-CoV-2 by  FDA under an Emergency Use Authorization (EUA). This EUA will remain  in effect (meaning this test can be used) for the duration of the  Covid-19 declaration under Section 564(b)(1) of the Act, 21  U.S.C. section 360bbb-3(b)(1), unless the authorization is  terminated or revoked. Performed at [facility], [address] [place], [place] [address]      Radiology Studies:  IR Aortagram Abdominal Serialogram  Result Date: 10/16/2019 INDICATION: Large  retroperitoneal hemorrhage with CTA demonstrating pseudoaneurysm to the left of the infrarenal abdominal aorta and increasing retroperitoneal blood. Bleeding appears to be in close proximity to the expected origin the left ovarian artery by CTA but also the left ovarian vein. EXAM: 1. ULTRASOUND GUIDANCE FOR VASCULAR ACCESS OF THE RIGHT COMMON FEMORAL  ARTERY 2. ABDOMINAL AORTOGRAM 3. SELECTIVE ARTERIOGRAPHY OF THE LEFT RENAL ARTERY 4. ULTRASOUND GUIDANCE FOR VASCULAR ACCESS OF THE RIGHT COMMON FEMORAL VEIN 5. IVC VENOGRAPHY 6. SELECTIVE VENOGRAPHY OF THE LEFT RENAL VEIN MEDICATIONS: No additional medications. ANESTHESIA/SEDATION: Moderate (conscious) sedation was employed during this procedure. A total of Versed 6.0 mg and 1 mg IV dilaudid was administered intravenously. Moderate Sedation Time: 125 minutes. The patient's level of consciousness and vital signs were monitored continuously by radiology nursing throughout the procedure under my direct supervision. CONTRAST:  200 mL Visipaque 320 FLUOROSCOPY TIME:  Fluoroscopy Time: 30 minutes and 54 seconds. 668.6 mGy. COMPLICATIONS: None immediate. PROCEDURE: Informed consent was obtained from the patient following explanation of the procedure, risks, benefits and alternatives. The patient understands, agrees and consents for the procedure. All questions were addressed. A time out was performed prior to the initiation of the procedure. Maximal barrier sterile technique utilized including caps, mask, sterile gowns, sterile gloves, large sterile drape, hand hygiene, and chlorhexidine prep. Ultrasound of the right groin was performed to confirm patency of the right common femoral artery and right common femoral vein. During the procedure local anesthesia was provided with 1% lidocaine. Initial access of the right common femoral artery was performed under direct ultrasound guidance with a 21 gauge needle and micropuncture set. Ultrasound image documentation was performed.  After access, a 5 [place] vascular sheath was placed. A 5 French pigtail catheter was then advanced into the abdominal aorta and abdominal aortography performed with the pigtail catheter centered just above the level of the renal arteries. Additional magnified oblique and lateral projection arteriography was also performed over the mid abdominal aorta with injection of the pigtail catheter. The pigtail catheter was exchanged for a 5 [place] Cobra catheter. This was used to selectively catheterize the left renal artery. Selective left renal arteriography was performed. The Cobra catheter was exchanged for a 5 [place] Sos catheter. This was used to probe the mid abdominal aorta with contrast injection performed at multiple levels. A coaxial lantern microcatheter and small caliber guidewire were also advanced through the Sos catheter and used to probe the abdominal aorta. Access of the right common femoral vein was then performed under direct ultrasound guidance with a 21 gauge needle and micropuncture set. Ultrasound image documentation was performed. A 5 French vascular sheath was placed. A 5 French pigtail catheter was advanced into the lower IVC and IVC venography performed. The pigtail catheter was exchanged for a 5 [place] Cobra catheter was used to selectively catheterize the left renal vein. Guidewire access was performed further to the level of the left kidney and a 5 [place] KMP catheter further advanced into the left renal vein. Selective left renal venography was then performed. Closure at the arteriotomy site was performed with the Cordis ExoSeal device. The femoral venous sheath was removed and manual compression held. FINDINGS: Abdominal aortogram demonstrates a normal caliber abdominal aorta without evidence of aneurysm, stenosis or dissection. Bilateral single renal arteries appear normally patent. There is focal contrast extravasation along the left side of the infrarenal aorta filling a bilobed shaped  pseudoaneurysm as depicted by CTA and faint filling of a contiguous descending vessel felt to most likely represent an abnormal left ovarian artery. Additional oblique magnified arteriography confirms similar findings with essentially pinhole arterial extravasation into the pseudoaneurysm. The expected region of contrast extravasation was interrogated with a 5 French catheter as well as a microcatheter. Despite being able to partially visualize contrast extravasation at times, the exact site of extravasation  could not be localized or catheterized to allow transcatheter treatment. Lumbar arteries were well delineated and the source of bleeding does not appear to be from a left-sided lumbar artery. Selective left renal arteriography was also performed to exclude a renal artery source for bleeding. A single left renal artery and its branches are well delineated and demonstrate normal patency without evidence bleeding, pseudoaneurysm or other abnormality. Decision was made to also study venous structures and exclude a venous source for bleeding. IVC venography demonstrates normal patency of the inferior vena cava. Mild mass effect on the infrarenal segment of the inferior vena cava is likely on the basis of the large amount of retroperitoneal hemorrhage present. No evidence of thrombus. Renal vein inflow is identified bilaterally. Selective left renal venography was also performed demonstrating normal patency of the left renal vein and intrarenal venous branches. There is no reflux into the left ovarian vein, evidence of AV malformation or contrast extravasation. IMPRESSION: 1. Arterial source of active bleeding having the appearance of a left-sided infrarenal pinhole bleed near the expected origin of the left ovarian artery filling a bilobed pseudoaneurysm with faint opacification of a contiguous left ovarian artery descending into the pelvis. Source of bleeding is located approximately 2 cm inferior to the left renal  artery orifice. 2. Normal IVC and left renal venography. No evidence of venous source for bleeding. 3. Findings were reviewed directly with Dr. Chestine Spore who was present for much of the procedure and will be planning endovascular repair of the aorta to treat the site of active hemorrhage. Electronically Signed   By: Irish Lack M.D.   On: 10/16/2019 09:46   IR Angiogram Renal Left Selective  Result Date: 10/16/2019 INDICATION: Large retroperitoneal hemorrhage with CTA demonstrating pseudoaneurysm to the left of the infrarenal abdominal aorta and increasing retroperitoneal blood. Bleeding appears to be in close proximity to the expected origin the left ovarian artery by CTA but also the left ovarian vein. EXAM: 1. ULTRASOUND GUIDANCE FOR VASCULAR ACCESS OF THE RIGHT COMMON FEMORAL ARTERY 2. ABDOMINAL AORTOGRAM 3. SELECTIVE ARTERIOGRAPHY OF THE LEFT RENAL ARTERY 4. ULTRASOUND GUIDANCE FOR VASCULAR ACCESS OF THE RIGHT COMMON FEMORAL VEIN 5. IVC VENOGRAPHY 6. SELECTIVE VENOGRAPHY OF THE LEFT RENAL VEIN MEDICATIONS: No additional medications. ANESTHESIA/SEDATION: Moderate (conscious) sedation was employed during this procedure. A total of Versed 6.0 mg and 1 mg IV dilaudid was administered intravenously. Moderate Sedation Time: 125 minutes. The patient's level of consciousness and vital signs were monitored continuously by radiology nursing throughout the procedure under my direct supervision. CONTRAST:  200 mL Visipaque 320 FLUOROSCOPY TIME:  Fluoroscopy Time: 30 minutes and 54 seconds. 668.6 mGy. COMPLICATIONS: None immediate. PROCEDURE: Informed consent was obtained from the patient following explanation of the procedure, risks, benefits and alternatives. The patient understands, agrees and consents for the procedure. All questions were addressed. A time out was performed prior to the initiation of the procedure. Maximal barrier sterile technique utilized including caps, mask, sterile gowns, sterile gloves, large  sterile drape, hand hygiene, and chlorhexidine prep. Ultrasound of the right groin was performed to confirm patency of the right common femoral artery and right common femoral vein. During the procedure local anesthesia was provided with 1% lidocaine. Initial access of the right common femoral artery was performed under direct ultrasound guidance with a 21 gauge needle and micropuncture set. Ultrasound image documentation was performed. After access, a 5 [place] vascular sheath was placed. A 5 French pigtail catheter was then advanced into the abdominal aorta and abdominal aortography  performed with the pigtail catheter centered just above the level of the renal arteries. Additional magnified oblique and lateral projection arteriography was also performed over the mid abdominal aorta with injection of the pigtail catheter. The pigtail catheter was exchanged for a 5 [place] Cobra catheter. This was used to selectively catheterize the left renal artery. Selective left renal arteriography was performed. The Cobra catheter was exchanged for a 5 [place] Sos catheter. This was used to probe the mid abdominal aorta with contrast injection performed at multiple levels. A coaxial lantern microcatheter and small caliber guidewire were also advanced through the Sos catheter and used to probe the abdominal aorta. Access of the right common femoral vein was then performed under direct ultrasound guidance with a 21 gauge needle and micropuncture set. Ultrasound image documentation was performed. A 5 French vascular sheath was placed. A 5 French pigtail catheter was advanced into the lower IVC and IVC venography performed. The pigtail catheter was exchanged for a 5 [place] Cobra catheter was used to selectively catheterize the left renal vein. Guidewire access was performed further to the level of the left kidney and a 5 [place] KMP catheter further advanced into the left renal vein. Selective left renal venography was then performed.  Closure at the arteriotomy site was performed with the Cordis ExoSeal device. The femoral venous sheath was removed and manual compression held. FINDINGS: Abdominal aortogram demonstrates a normal caliber abdominal aorta without evidence of aneurysm, stenosis or dissection. Bilateral single renal arteries appear normally patent. There is focal contrast extravasation along the left side of the infrarenal aorta filling a bilobed shaped pseudoaneurysm as depicted by CTA and faint filling of a contiguous descending vessel felt to most likely represent an abnormal left ovarian artery. Additional oblique magnified arteriography confirms similar findings with essentially pinhole arterial extravasation into the pseudoaneurysm. The expected region of contrast extravasation was interrogated with a 5 French catheter as well as a microcatheter. Despite being able to partially visualize contrast extravasation at times, the exact site of extravasation could not be localized or catheterized to allow transcatheter treatment. Lumbar arteries were well delineated and the source of bleeding does not appear to be from a left-sided lumbar artery. Selective left renal arteriography was also performed to exclude a renal artery source for bleeding. A single left renal artery and its branches are well delineated and demonstrate normal patency without evidence bleeding, pseudoaneurysm or other abnormality. Decision was made to also study venous structures and exclude a venous source for bleeding. IVC venography demonstrates normal patency of the inferior vena cava. Mild mass effect on the infrarenal segment of the inferior vena cava is likely on the basis of the large amount of retroperitoneal hemorrhage present. No evidence of thrombus. Renal vein inflow is identified bilaterally. Selective left renal venography was also performed demonstrating normal patency of the left renal vein and intrarenal venous branches. There is no reflux into the  left ovarian vein, evidence of AV malformation or contrast extravasation. IMPRESSION: 1. Arterial source of active bleeding having the appearance of a left-sided infrarenal pinhole bleed near the expected origin of the left ovarian artery filling a bilobed pseudoaneurysm with faint opacification of a contiguous left ovarian artery descending into the pelvis. Source of bleeding is located approximately 2 cm inferior to the left renal artery orifice. 2. Normal IVC and left renal venography. No evidence of venous source for bleeding. 3. Findings were reviewed directly with Dr. Chestine Spore who was present for much of the procedure and will be planning  endovascular repair of the aorta to treat the site of active hemorrhage. Electronically Signed   By: Irish Lack M.D.   On: 10/16/2019 09:46   IR Venocavagram Ivc  Result Date: 10/16/2019 INDICATION: Large retroperitoneal hemorrhage with CTA demonstrating pseudoaneurysm to the left of the infrarenal abdominal aorta and increasing retroperitoneal blood. Bleeding appears to be in close proximity to the expected origin the left ovarian artery by CTA but also the left ovarian vein. EXAM: 1. ULTRASOUND GUIDANCE FOR VASCULAR ACCESS OF THE RIGHT COMMON FEMORAL ARTERY 2. ABDOMINAL AORTOGRAM 3. SELECTIVE ARTERIOGRAPHY OF THE LEFT RENAL ARTERY 4. ULTRASOUND GUIDANCE FOR VASCULAR ACCESS OF THE RIGHT COMMON FEMORAL VEIN 5. IVC VENOGRAPHY 6. SELECTIVE VENOGRAPHY OF THE LEFT RENAL VEIN MEDICATIONS: No additional medications. ANESTHESIA/SEDATION: Moderate (conscious) sedation was employed during this procedure. A total of Versed 6.0 mg and 1 mg IV dilaudid was administered intravenously. Moderate Sedation Time: 125 minutes. The patient's level of consciousness and vital signs were monitored continuously by radiology nursing throughout the procedure under my direct supervision. CONTRAST:  200 mL Visipaque 320 FLUOROSCOPY TIME:  Fluoroscopy Time: 30 minutes and 54 seconds. 668.6 mGy.  COMPLICATIONS: None immediate. PROCEDURE: Informed consent was obtained from the patient following explanation of the procedure, risks, benefits and alternatives. The patient understands, agrees and consents for the procedure. All questions were addressed. A time out was performed prior to the initiation of the procedure. Maximal barrier sterile technique utilized including caps, mask, sterile gowns, sterile gloves, large sterile drape, hand hygiene, and chlorhexidine prep. Ultrasound of the right groin was performed to confirm patency of the right common femoral artery and right common femoral vein. During the procedure local anesthesia was provided with 1% lidocaine. Initial access of the right common femoral artery was performed under direct ultrasound guidance with a 21 gauge needle and micropuncture set. Ultrasound image documentation was performed. After access, a 5 [place] vascular sheath was placed. A 5 French pigtail catheter was then advanced into the abdominal aorta and abdominal aortography performed with the pigtail catheter centered just above the level of the renal arteries. Additional magnified oblique and lateral projection arteriography was also performed over the mid abdominal aorta with injection of the pigtail catheter. The pigtail catheter was exchanged for a 5 [place] Cobra catheter. This was used to selectively catheterize the left renal artery. Selective left renal arteriography was performed. The Cobra catheter was exchanged for a 5 [place] Sos catheter. This was used to probe the mid abdominal aorta with contrast injection performed at multiple levels. A coaxial lantern microcatheter and small caliber guidewire were also advanced through the Sos catheter and used to probe the abdominal aorta. Access of the right common femoral vein was then performed under direct ultrasound guidance with a 21 gauge needle and micropuncture set. Ultrasound image documentation was performed. A 5 French vascular  sheath was placed. A 5 French pigtail catheter was advanced into the lower IVC and IVC venography performed. The pigtail catheter was exchanged for a 5 [place] Cobra catheter was used to selectively catheterize the left renal vein. Guidewire access was performed further to the level of the left kidney and a 5 [place] KMP catheter further advanced into the left renal vein. Selective left renal venography was then performed. Closure at the arteriotomy site was performed with the Cordis ExoSeal device. The femoral venous sheath was removed and manual compression held. FINDINGS: Abdominal aortogram demonstrates a normal caliber abdominal aorta without evidence of aneurysm, stenosis or dissection. Bilateral single renal arteries appear  normally patent. There is focal contrast extravasation along the left side of the infrarenal aorta filling a bilobed shaped pseudoaneurysm as depicted by CTA and faint filling of a contiguous descending vessel felt to most likely represent an abnormal left ovarian artery. Additional oblique magnified arteriography confirms similar findings with essentially pinhole arterial extravasation into the pseudoaneurysm. The expected region of contrast extravasation was interrogated with a 5 French catheter as well as a microcatheter. Despite being able to partially visualize contrast extravasation at times, the exact site of extravasation could not be localized or catheterized to allow transcatheter treatment. Lumbar arteries were well delineated and the source of bleeding does not appear to be from a left-sided lumbar artery. Selective left renal arteriography was also performed to exclude a renal artery source for bleeding. A single left renal artery and its branches are well delineated and demonstrate normal patency without evidence bleeding, pseudoaneurysm or other abnormality. Decision was made to also study venous structures and exclude a venous source for bleeding. IVC venography demonstrates  normal patency of the inferior vena cava. Mild mass effect on the infrarenal segment of the inferior vena cava is likely on the basis of the large amount of retroperitoneal hemorrhage present. No evidence of thrombus. Renal vein inflow is identified bilaterally. Selective left renal venography was also performed demonstrating normal patency of the left renal vein and intrarenal venous branches. There is no reflux into the left ovarian vein, evidence of AV malformation or contrast extravasation. IMPRESSION: 1. Arterial source of active bleeding having the appearance of a left-sided infrarenal pinhole bleed near the expected origin of the left ovarian artery filling a bilobed pseudoaneurysm with faint opacification of a contiguous left ovarian artery descending into the pelvis. Source of bleeding is located approximately 2 cm inferior to the left renal artery orifice. 2. Normal IVC and left renal venography. No evidence of venous source for bleeding. 3. Findings were reviewed directly with Dr. Chestine Spore who was present for much of the procedure and will be planning endovascular repair of the aorta to treat the site of active hemorrhage. Electronically Signed   By: Irish Lack M.D.   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 Bleeding appears to be in close proximity to the expected origin the left ovarian artery by CTA but also the left ovarian vein. EXAM: 1. ULTRASOUND GUIDANCE FOR VASCULAR ACCESS OF THE RIGHT COMMON FEMORAL ARTERY 2. ABDOMINAL AORTOGRAM 3. SELECTIVE ARTERIOGRAPHY OF THE LEFT RENAL ARTERY 4. ULTRASOUND GUIDANCE FOR VASCULAR ACCESS OF THE RIGHT COMMON FEMORAL VEIN 5. IVC VENOGRAPHY 6. SELECTIVE VENOGRAPHY OF THE LEFT RENAL VEIN MEDICATIONS: No additional medications. ANESTHESIA/SEDATION: Moderate  (conscious) sedation was employed during this procedure. A total of Versed 6.0 mg and 1 mg IV dilaudid was administered intravenously. Moderate Sedation Time: 125 minutes. The patient's level of consciousness and vital signs were monitored continuously by radiology nursing throughout the procedure under my direct supervision. CONTRAST:  200 mL Visipaque 320 FLUOROSCOPY TIME:  Fluoroscopy Time: 30 minutes and 54 seconds. 668.6 mGy. COMPLICATIONS: None immediate. PROCEDURE: Informed consent was obtained from the patient following explanation of the procedure, risks, benefits and alternatives. The patient understands, agrees and consents for the procedure. All questions were addressed. A time out was performed prior to the initiation of the procedure. Maximal barrier sterile technique utilized including caps, mask, sterile gowns, sterile gloves,  large sterile drape, hand hygiene, and chlorhexidine prep. Ultrasound of the right groin was performed to confirm patency of the right common femoral artery and right common femoral vein. During the procedure local anesthesia was provided with 1% lidocaine. Initial access of the right common femoral artery was performed under direct ultrasound guidance with a 21 gauge needle and micropuncture set. Ultrasound image documentation was performed. After access, a 5 [place] vascular sheath was placed. A 5 French pigtail catheter was then advanced into the abdominal aorta and abdominal aortography performed with the pigtail catheter centered just above the level of the renal arteries. Additional magnified oblique and lateral projection arteriography was also performed over the mid abdominal aorta with injection of the pigtail catheter. The pigtail catheter was exchanged for a 5 [place] Cobra catheter. This was used to selectively catheterize the left renal artery. Selective left renal arteriography was performed. The Cobra catheter was exchanged for a 5 [place] Sos catheter. This was  used to probe the mid abdominal aorta with contrast injection performed at multiple levels. A coaxial lantern microcatheter and small caliber guidewire were also advanced through the Sos catheter and used to probe the abdominal aorta. Access of the right common femoral vein was then performed under direct ultrasound guidance with a 21 gauge needle and micropuncture set. Ultrasound image documentation was performed. A 5 French vascular sheath was placed. A 5 French pigtail catheter was advanced into the lower IVC and IVC venography performed. The pigtail catheter was exchanged for a 5 [place] Cobra catheter was used to selectively catheterize the left renal vein. Guidewire access was performed further to the level of the left kidney and a 5 [place] KMP catheter further advanced into the left renal vein. Selective left renal venography was then performed. Closure at the arteriotomy site was performed with the Cordis ExoSeal device. The femoral venous sheath was removed and manual compression held. FINDINGS: Abdominal aortogram demonstrates a normal caliber abdominal aorta without evidence of aneurysm, stenosis or dissection. Bilateral single renal arteries appear normally patent. There is focal contrast extravasation along the left side of the infrarenal aorta filling a bilobed shaped pseudoaneurysm as depicted by CTA and faint filling of a contiguous descending vessel felt to most likely represent an abnormal left ovarian artery. Additional oblique magnified arteriography confirms similar findings with essentially pinhole arterial extravasation into the pseudoaneurysm. The expected region of contrast extravasation was interrogated with a 5 French catheter as well as a microcatheter. Despite being able to partially visualize contrast extravasation at times, the exact site of extravasation could not be localized or catheterized to allow transcatheter treatment. Lumbar arteries were well delineated and the source of  bleeding does not appear to be from a left-sided lumbar artery. Selective left renal arteriography was also performed to exclude a renal artery source for bleeding. A single left renal artery and its branches are well delineated and demonstrate normal patency without evidence bleeding, pseudoaneurysm or other abnormality. Decision was made to also study venous structures and exclude a venous source for bleeding. IVC venography demonstrates normal patency of the inferior vena cava. Mild mass effect on the infrarenal segment of the inferior vena cava is likely on the basis of the large amount of retroperitoneal hemorrhage present. No evidence of thrombus. Renal vein inflow is identified bilaterally. Selective left renal venography was also performed demonstrating normal patency of the left renal vein and intrarenal venous branches. There is no reflux into the left ovarian vein, evidence of AV malformation or contrast extravasation. IMPRESSION:  1. Arterial source of active bleeding having the appearance of a left-sided infrarenal pinhole bleed near the expected origin of the left ovarian artery filling a bilobed pseudoaneurysm with faint opacification of a contiguous left ovarian artery descending into the pelvis. Source of bleeding is located approximately 2 cm inferior to the left renal artery orifice. 2. Normal IVC and left renal venography. No evidence of venous source for bleeding. 3. Findings were reviewed directly with Dr. Chestine Sporelark who was present for much of the procedure and will be planning endovascular repair of the aorta to treat the site of active hemorrhage. Electronically Signed   By: Irish LackGlenn  Yamagata M.D.   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 Bleeding appears to be in close proximity to the  expected origin the left ovarian artery by CTA but also the left ovarian vein. EXAM: 1. ULTRASOUND GUIDANCE FOR VASCULAR ACCESS OF THE RIGHT COMMON FEMORAL ARTERY 2. ABDOMINAL AORTOGRAM 3. SELECTIVE ARTERIOGRAPHY OF THE LEFT RENAL ARTERY 4. ULTRASOUND GUIDANCE FOR VASCULAR ACCESS OF THE RIGHT COMMON FEMORAL VEIN 5. IVC VENOGRAPHY 6. SELECTIVE VENOGRAPHY OF THE LEFT RENAL VEIN MEDICATIONS: No additional medications. ANESTHESIA/SEDATION: Moderate (conscious) sedation was employed during this procedure. A total of Versed 6.0 mg and 1 mg IV dilaudid was administered intravenously. Moderate Sedation Time: 125 minutes. The patient's level of consciousness and vital signs were monitored continuously by radiology nursing throughout the procedure under my direct supervision. CONTRAST:  200 mL Visipaque 320 FLUOROSCOPY TIME:  Fluoroscopy Time: 30 minutes and 54 seconds. 668.6 mGy. COMPLICATIONS: None immediate. PROCEDURE: Informed consent was obtained from the patient following explanation of the procedure, risks, benefits and alternatives. The patient understands, agrees and consents for the procedure. All questions were addressed. A time out was performed prior to the initiation of the procedure. Maximal barrier sterile technique utilized including caps, mask, sterile gowns, sterile gloves, large sterile drape, hand hygiene, and chlorhexidine prep. Ultrasound of the right groin was performed to confirm patency of the right common femoral artery and right common femoral vein. During the procedure local anesthesia was provided with 1% lidocaine. Initial access of the right common femoral artery was performed under direct ultrasound guidance with a 21 gauge needle and micropuncture set. Ultrasound image documentation was performed. After access, a 5 [place] vascular sheath was placed. A 5 French pigtail catheter was then advanced into the abdominal aorta and abdominal aortography performed with the pigtail catheter centered  just above the level of the renal arteries. Additional magnified oblique and lateral projection arteriography was also performed over the mid abdominal aorta with injection of the pigtail catheter. The pigtail catheter was exchanged for a 5 [place] Cobra catheter. This was used to selectively catheterize the left renal artery. Selective left renal arteriography was performed. The Cobra catheter was exchanged for a 5 [place] Sos catheter. This was used to probe the mid abdominal aorta with contrast injection performed at multiple levels. A coaxial lantern microcatheter and small caliber guidewire were also advanced through the Sos catheter and used to probe the abdominal aorta. Access of the right common femoral vein was then performed under direct ultrasound guidance with a 21 gauge needle and micropuncture set. Ultrasound image documentation was performed. A 5 French vascular sheath was placed. A 5 French pigtail catheter was advanced into the lower IVC and IVC venography performed. The pigtail  catheter was exchanged for a 5 [place] Cobra catheter was used to selectively catheterize the left renal vein. Guidewire access was performed further to the level of the left kidney and a 5 [place] KMP catheter further advanced into the left renal vein. Selective left renal venography was then performed. Closure at the arteriotomy site was performed with the Cordis ExoSeal device. The femoral venous sheath was removed and manual compression held. FINDINGS: Abdominal aortogram demonstrates a normal caliber abdominal aorta without evidence of aneurysm, stenosis or dissection. Bilateral single renal arteries appear normally patent. There is focal contrast extravasation along the left side of the infrarenal aorta filling a bilobed shaped pseudoaneurysm as depicted by CTA and faint filling of a contiguous descending vessel felt to most likely represent an abnormal left ovarian artery. Additional oblique magnified arteriography  confirms similar findings with essentially pinhole arterial extravasation into the pseudoaneurysm. The expected region of contrast extravasation was interrogated with a 5 French catheter as well as a microcatheter. Despite being able to partially visualize contrast extravasation at times, the exact site of extravasation could not be localized or catheterized to allow transcatheter treatment. Lumbar arteries were well delineated and the source of bleeding does not appear to be from a left-sided lumbar artery. Selective left renal arteriography was also performed to exclude a renal artery source for bleeding. A single left renal artery and its branches are well delineated and demonstrate normal patency without evidence bleeding, pseudoaneurysm or other abnormality. Decision was made to also study venous structures and exclude a venous source for bleeding. IVC venography demonstrates normal patency of the inferior vena cava. Mild mass effect on the infrarenal segment of the inferior vena cava is likely on the basis of the large amount of retroperitoneal hemorrhage present. No evidence of thrombus. Renal vein inflow is identified bilaterally. Selective left renal venography was also performed demonstrating normal patency of the left renal vein and intrarenal venous branches. There is no reflux into the left ovarian vein, evidence of AV malformation or contrast extravasation. IMPRESSION: 1. Arterial source of active bleeding having the appearance of a left-sided infrarenal pinhole bleed near the expected origin of the left ovarian artery filling a bilobed pseudoaneurysm with faint opacification of a contiguous left ovarian artery descending into the pelvis. Source of bleeding is located approximately 2 cm inferior to the left renal artery orifice. 2. Normal IVC and left renal venography. No evidence of venous source for bleeding. 3. Findings were reviewed directly with Dr. Chestine Spore who was present for much of the procedure  and will be planning endovascular repair of the aorta to treat the site of active hemorrhage. Electronically Signed   By: Irish Lack M.D.   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 Bleeding appears to be in close proximity to the expected origin the left ovarian artery by CTA but also the left ovarian vein. EXAM: 1. ULTRASOUND GUIDANCE FOR VASCULAR ACCESS OF THE RIGHT COMMON FEMORAL ARTERY 2. ABDOMINAL AORTOGRAM 3. SELECTIVE ARTERIOGRAPHY OF THE LEFT RENAL ARTERY 4. ULTRASOUND GUIDANCE FOR VASCULAR ACCESS OF THE RIGHT COMMON FEMORAL VEIN 5. IVC VENOGRAPHY 6. SELECTIVE VENOGRAPHY OF THE LEFT RENAL VEIN MEDICATIONS: No additional medications. ANESTHESIA/SEDATION: Moderate (conscious) sedation was employed during this procedure. A total of Versed 6.0 mg and 1 mg IV dilaudid was administered intravenously. Moderate Sedation Time: 125 minutes. The patient's  level of consciousness and vital signs were monitored continuously by radiology nursing throughout the procedure under my direct supervision. CONTRAST:  200 mL Visipaque 320 FLUOROSCOPY TIME:  Fluoroscopy Time: 30 minutes and 54 seconds. 668.6 mGy. COMPLICATIONS: None immediate. PROCEDURE: Informed consent was obtained from the patient following explanation of the procedure, risks, benefits and alternatives. The patient understands, agrees and consents for the procedure. All questions were addressed. A time out was performed prior to the initiation of the procedure. Maximal barrier sterile technique utilized including caps, mask, sterile gowns, sterile gloves, large sterile drape, hand hygiene, and chlorhexidine prep. Ultrasound of the right groin was performed to confirm patency of the right common femoral artery and right common femoral vein. During the  procedure local anesthesia was provided with 1% lidocaine. Initial access of the right common femoral artery was performed under direct ultrasound guidance with a 21 gauge needle and micropuncture set. Ultrasound image documentation was performed. After access, a 5 [place] vascular sheath was placed. A 5 French pigtail catheter was then advanced into the abdominal aorta and abdominal aortography performed with the pigtail catheter centered just above the level of the renal arteries. Additional magnified oblique and lateral projection arteriography was also performed over the mid abdominal aorta with injection of the pigtail catheter. The pigtail catheter was exchanged for a 5 [place] Cobra catheter. This was used to selectively catheterize the left renal artery. Selective left renal arteriography was performed. The Cobra catheter was exchanged for a 5 [place] Sos catheter. This was used to probe the mid abdominal aorta with contrast injection performed at multiple levels. A coaxial lantern microcatheter and small caliber guidewire were also advanced through the Sos catheter and used to probe the abdominal aorta. Access of the right common femoral vein was then performed under direct ultrasound guidance with a 21 gauge needle and micropuncture set. Ultrasound image documentation was performed. A 5 French vascular sheath was placed. A 5 French pigtail catheter was advanced into the lower IVC and IVC venography performed. The pigtail catheter was exchanged for a 5 [place] Cobra catheter was used to selectively catheterize the left renal vein. Guidewire access was performed further to the level of the left kidney and a 5 [place] KMP catheter further advanced into the left renal vein. Selective left renal venography was then performed. Closure at the arteriotomy site was performed with the Cordis ExoSeal device. The femoral venous sheath was removed and manual compression held. FINDINGS: Abdominal aortogram demonstrates a  normal caliber abdominal aorta without evidence of aneurysm, stenosis or dissection. Bilateral single renal arteries appear normally patent. There is focal contrast extravasation along the left side of the infrarenal aorta filling a bilobed shaped pseudoaneurysm as depicted by CTA and faint filling of a contiguous descending vessel felt to most likely represent an abnormal left ovarian artery. Additional oblique magnified arteriography confirms similar findings with essentially pinhole arterial extravasation into the pseudoaneurysm. The expected region of contrast extravasation was interrogated with a 5 French catheter as well as a microcatheter. Despite being able to partially visualize contrast extravasation at times, the exact site of extravasation could not be localized or catheterized to allow transcatheter treatment. Lumbar arteries were well delineated and the source of bleeding does not appear to be from a left-sided lumbar artery. Selective left renal arteriography was also performed to exclude a renal artery source for bleeding. A single left renal artery and its branches are well delineated and demonstrate normal patency without evidence bleeding, pseudoaneurysm or other abnormality. Decision was  made to also study venous structures and exclude a venous source for bleeding. IVC venography demonstrates normal patency of the inferior vena cava. Mild mass effect on the infrarenal segment of the inferior vena cava is likely on the basis of the large amount of retroperitoneal hemorrhage present. No evidence of thrombus. Renal vein inflow is identified bilaterally. Selective left renal venography was also performed demonstrating normal patency of the left renal vein and intrarenal venous branches. There is no reflux into the left ovarian vein, evidence of AV malformation or contrast extravasation. IMPRESSION: 1. Arterial source of active bleeding having the appearance of a left-sided infrarenal pinhole bleed  near the expected origin of the left ovarian artery filling a bilobed pseudoaneurysm with faint opacification of a contiguous left ovarian artery descending into the pelvis. Source of bleeding is located approximately 2 cm inferior to the left renal artery orifice. 2. Normal IVC and left renal venography. No evidence of venous source for bleeding. 3. Findings were reviewed directly with Dr. Chestine Spore who was present for much of the procedure and will be planning endovascular repair of the aorta to treat the site of active hemorrhage. Electronically Signed   By: Irish Lack M.D.   On: 10/16/2019 09:46   DG Chest Port 1 View  Result Date: 10/15/2019 CLINICAL DATA:  Retroperitoneal hematoma, endovascular aortic stent graft EXAM: PORTABLE CHEST 1 VIEW COMPARISON:  None. FINDINGS: Single frontal view of the chest demonstrates right internal jugular catheter tip overlying superior vena cava. The cardiac silhouette is unremarkable. There is patchy consolidation within the medial aspect of the lung bases bilaterally, likely atelectasis. No effusion or pneumothorax. No acute bony abnormalities. IMPRESSION: 1. Bilateral lower lobe atelectasis.  Otherwise unremarkable exam. Electronically Signed   By: Sharlet Salina M.D.   On: 10/15/2019 22:08   HYBRID OR IMAGING ([facility] ONLY)  Result Date: 10/15/2019 There is no interpretation for this exam.  This order is for images obtained during a surgical procedure.  Please See "Surgeries" Tab for more information regarding the procedure.   CT Angio Abd/Pel w/ and/or w/o  Result Date: 10/15/2019 CLINICAL DATA:  54 year old with a large retroperitoneal hemorrhage and focal area of contrast extravasation on previous CT of the abdomen and pelvis. CTA performed in order to try to localize the source of the bleeding. EXAM: CT ANGIOGRAPHY ABDOMEN AND PELVIS WITH CONTRAST AND WITHOUT CONTRAST TECHNIQUE: Multidetector CT imaging of the abdomen and pelvis was performed using the standard  protocol during bolus administration of intravenous contrast. Multiplanar reconstructed images and MIPs were obtained and reviewed to evaluate the vascular anatomy. CONTRAST:  OMNIPAQUE IOHEXOL 300 MG/ML  SOLN COMPARISON:  CT abdomen and pelvis 10/14/2019 FINDINGS: VASCULAR Aorta: Normal caliber of the abdominal aorta without dissection or any significant atherosclerotic disease. There is an enhancing bilobed structure just left of the abdominal aorta situated between the left renal artery and the IMA. This structure enhances during the arterial phase of contrast although the contrast enhancement is less dense compared to the aorta and mesenteric vessels. This structure is brighter on the venous phase imaging. This bilobed vascular structure measures up to 3.6 cm in greatest diameter and similar to the examination from 10/14/2019. Findings are suggestive for large irregular pseudoaneurysm. There is an irregular vessel connected to this bilobed structure and this vessel extends down to the left ovary. This enhancing structure is very irregular and measures up to 5 mm. Prior study raised concern that this vessel could represent the left ovarian vein but likely  represents left ovarian artery with underlying vascular disease such as FMD. Celiac: The celiac trunk is widely patent. There is mild irregularity and beading of the right common hepatic artery. Findings raise concern for fibromuscular dysplasia. Hepatic arteries and GDA are patent. There may be a slightly beaded appearance of the proximal splenic artery. Small amount of flow in the left gastric artery. SMA: SMA is patent. Mild irregularity of the mid SMA is also concerning for fibromuscular dysplasia. Renals: Bilateral renal arteries are patent without dissection. No evidence for atherosclerotic disease or stenosis. No evidence for stenoses or aneurysms involving the renal arteries. IMA: IMA is very small but patent. Inflow: Common, internal and external  iliac arteries are widely patent. Beaded appearance of the right external iliac artery is suggestive for fibromuscular dysplasia. Beaded appearance of the left external iliac artery with focal ectasia in the distal left external iliac artery measuring up to 7 mm. Proximal Outflow: Proximal femoral arteries are patent bilaterally. Veins: Normal appearance of the portal venous system. SMV and IMV are patent. IVC and bilateral renal veins are patent. Focal outpouching in left renal vein at the expected origin of the left ovarian vein. Iliac venous structures are patent. Review of the MIP images confirms the above findings. NON-VASCULAR Lower chest: New small left pleural effusion. Again noted is a pleural-based nodule along the left major fissure on sequence 10, image 2 that measures roughly 5 mm. Again noted is a fluid or blood in the right costophrenic region. Hepatobiliary: There is now a small amount of perihepatic ascites near the hepatic dome. Contrast within the gallbladder compatible with vicarious excretion of contrast. Again noted are low-density structures in liver likely representing hepatic cysts. Pancreas: Normal appearance of the pancreas. Spleen: Normal in size without focal abnormality. Adrenals/Urinary Tract: No gross abnormality to the adrenal glands. Again noted is abnormality along the superolateral aspect of the right kidney. There is retained contrast in this area on the precontrast images and abnormal perfusion on the post contrast images. Again noted is marked displacement of the left kidney related to the large retroperitoneal hemorrhage. Some abnormal perfusion along the left kidney upper pole is similar to the previous examination. Again noted is fullness of the left renal collecting system compatible with the compression from the retroperitoneal hematoma. Stomach/Bowel: Duodenum is displaced anteriorly from a large retroperitoneal hemorrhage. No evidence for bowel obstruction. Lymphatic: No  significant lymphadenopathy in the abdomen or pelvis. Reproductive: Uterus and bilateral adnexa are unremarkable. Other: Again noted is a massive amount of blood within the abdomen with a large left retroperitoneal hematoma. Hematoma measures up to 11.1 cm in the AP dimension on sequence 8, image 90 and roughly measured 10.7 cm on the study from 10/14/2019. Increased blood around the liver. Overall, there appears to be slightly increased retroperitoneal blood. Again noted is a large amount of blood between the aorta and the transverse duodenum. Again noted is blood in the right abdomen and there is increased blood around the uterus. There is now fluid in the pelvis which is new. Musculoskeletal: No acute bone abnormality. IMPRESSION: VASCULAR 1. 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 This pseudoaneurysm has bilobed and irregular configuration and measures up to 3.6 cm. The size has not significantly changed from the comparison on 10/14/2019. There is a vessel in continuity with the pseudoaneurysm which could represent an abnormal left ovarian artery. The origin of the left ovarian artery is not clearly identified and the exact source for this pseudoaneurysm is not  identified. 2. Irregularity, ectasia and beaded appearance of multiple vessels including bilateral external iliac arteries, common hepatic artery, splenic artery and SMA. These findings raise concern for an underlying vascular abnormality such as fibromuscular dysplasia. The vessel attached to the pseudoaneurysm also has irregularity and suggests that the etiology for the retroperitoneal hemorrhage was rupture of the small artery which may represent the left ovarian artery. 3. Pseudoaneurysm is in close proximity to the expected region of the left ovarian vein and an arteriovenous malformation cannot be excluded. NON-VASCULAR 1. Large retroperitoneal hematoma with increased fluid and blood within the abdominal cavity and  pelvis. 2. Again noted are irregular areas of enhancement involving the both kidneys upper poles, particularly along the right side. Findings raise concern for an inflammatory process or vascular compromise in these areas. Fullness of left renal collecting system related to the large hematoma. 3. New small left pleural effusion. Electronically Signed   By: Richarda Overlie M.D.   On: 10/15/2019 15:44     Scheduled Meds:   . [START ON 10/18/2019] aspirin EC  81 mg Oral Q0600  . buPROPion  450 mg Oral Daily  . busPIRone  5 mg Oral BID  . Chlorhexidine Gluconate Cloth  6 each Topical Daily  . docusate sodium  100 mg Oral Daily  . folic acid  1 mg Oral Daily  . multivitamin with minerals  1 tablet Oral Daily  . pantoprazole  40 mg Oral Daily  . thiamine  100 mg Oral Daily   Or  . thiamine  100 mg Intravenous Daily    Continuous Infusions:   . sodium chloride    . sodium chloride Stopped (10/16/19 0700)  . vancomycin Stopped (10/16/19 0700)     LOS: 2 days     Marcellus Scott, MD, [place], [facility]. [facility]    To contact the attending provider between 7A-7P or the covering provider during after hours 7P-7A, please log into the web site [email or web address] and access using universal [place] password for that web site. If you do not have the password, please call the hospital operator.  10/16/2019, 11:06 AM

## 2019-10-16 NOTE — Progress Notes (Addendum)
  Progress Note    10/16/2019 9:48 AM 1 Day Post-Op  Subjective:  Feels better.  Does not want to go through this pain again and is worse than birthing her children.  Says she has passed some gas.  Would like to pass more.   Tm 99.2 now afebrile   Vitals:   10/16/19 0322 10/16/19 0757  BP: 126/62 134/67  Pulse: 85 94  Resp: 16 (!) 21  Temp: 99.2 F (37.3 C) 98.4 F (36.9 C)  SpO2: 97% 100%    Physical Exam: Cardiac:  regular Lungs:  Non labored Incisions:  Left groin soft without hematoma Extremities:  Easily palpable pedal pulses bilaterally Abdomen:  Soft, NT/ND  CBC    Component Value Date/Time   WBC 20.7 (H) 10/16/2019 0730   RBC 3.07 (L) 10/16/2019 0730   HGB 9.3 (L) 10/16/2019 0730   HCT 27.6 (L) 10/16/2019 0730   PLT 226 10/16/2019 0730   MCV 89.9 10/16/2019 0730   MCH 30.3 10/16/2019 0730   MCHC 33.7 10/16/2019 0730   RDW 15.0 10/16/2019 0730   LYMPHSABS 0.7 10/16/2019 0730   MONOABS 0.5 10/16/2019 0730   EOSABS 0.0 10/16/2019 0730   BASOSABS 0.0 10/16/2019 0730    BMET    Component Value Date/Time   NA 137 10/16/2019 0730   K 4.4 10/16/2019 0730   CL 103 10/16/2019 0730   CO2 25 10/16/2019 0730   GLUCOSE 135 (H) 10/16/2019 0730   BUN 22 (H) 10/16/2019 0730   CREATININE 0.99 10/16/2019 0730   CALCIUM 8.6 (L) 10/16/2019 0730   GFRNONAA >60 10/16/2019 0730   GFRAA >60 10/16/2019 0730    INR    Component Value Date/Time   INR 1.2 10/15/2019 2215     Intake/Output Summary (Last 24 hours) at 10/16/2019 0948 Last data filed at 10/16/2019 0450 Gross per 24 hour  Intake 1274.23 ml  Output 875 ml  Net 399.23 ml     Assessment:  54 y.o. female is s/p:  1.  Percutaneous access and closure of the left common femoral artery for delivery of endograft 2.  Aortogram 3.  Endovascular repair of the infrarenal abdominal aorta by deployment of aortic stent graft x2 (23 mm x 3.3 cm Gore Aortic Extender Endoprothesis)  1 Day Post-Op  Plan: -pt  feeling much better this am.  Her left groin is soft without hematoma.  She has palpable pedal pulses bilaterally. -creatinine is 1.0 this am.  Ok to dc IVF from vascular standpoint.  -acute blood loss anemia-slightly decreased this am from last evening - most likely due to dilution from IVF.  -leukocytosis but down slightly at 20.7k -will need daily baby asa-this has been ordered to start tomorrow -oob and mobilize -f/u with Dr. Chestine Spore in 4 weeks with CTA-our office will arrange appt.    Doreatha Massed, PA-C [facility] [phone] 10/16/2019 9:48 AM  I have seen and evaluated the patient. I agree with the PA note as documented above. POD#1 s/p aortic stent x2 for repair of infra-renal aortic injury of unclear etiology with large left RP hematoma.  Left groin looks good.  Palpable left pedal pulse.  Hgb 10 --> 9.3 today.  Hemodynamically stable.    Cephus Shelling, MD [facility] [place] Office: [phone]

## 2019-10-17 LAB — BASIC METABOLIC PANEL
Anion gap: 8 (ref 5–15)
BUN: 13 mg/dL (ref 6–20)
CO2: 24 mmol/L (ref 22–32)
Calcium: 8.3 mg/dL — ABNORMAL LOW (ref 8.9–10.3)
Chloride: 106 mmol/L (ref 98–111)
Creatinine, Ser: 0.75 mg/dL (ref 0.44–1.00)
GFR calc Af Amer: >60 mL/min (ref >60)
GFR calc non Af Amer: >60 mL/min (ref >60)
Glucose, Bld: 92 mg/dL (ref 70–99)
Potassium: 4 mmol/L (ref 3.5–5.1)
Sodium: 138 mmol/L (ref 135–145)

## 2019-10-17 LAB — CBC
HCT: 23.5 % — ABNORMAL LOW (ref 36.0–46.0)
HCT: 25.5 % — ABNORMAL LOW (ref 36.0–46.0)
Hemoglobin: 8.1 g/dL — ABNORMAL LOW (ref 12.0–15.0)
Hemoglobin: 8.6 g/dL — ABNORMAL LOW (ref 12.0–15.0)
MCH: 30.7 pg (ref 26.0–34.0)
MCH: 30.7 pg (ref 26.0–34.0)
MCHC: 34.5 g/dL (ref 30.0–36.0)
MCHC: 33.7 g/dL (ref 30.0–36.0)
MCV: 89 fL (ref 80.0–100.0)
MCV: 91.1 fL (ref 80.0–100.0)
Platelets: 210 10*3/uL (ref 150–400)
Platelets: 233 10*3/uL (ref 150–400)
RBC: 2.64 MIL/uL — ABNORMAL LOW (ref 3.87–5.11)
RBC: 2.8 MIL/uL — ABNORMAL LOW (ref 3.87–5.11)
RDW: 14.7 % (ref 11.5–15.5)
RDW: 14.9 % (ref 11.5–15.5)
WBC: 18.8 10*3/uL — ABNORMAL HIGH (ref 4.0–10.5)
WBC: 13.7 10*3/uL — ABNORMAL HIGH (ref 4.0–10.5)
nRBC: 0 % (ref 0.0–0.2)
nRBC: 0 % (ref 0.0–0.2)

## 2019-10-17 MED ORDER — SENNA 8.6 MG PO TABS
1.0000 | ORAL_TABLET | Freq: Every day | ORAL | Status: DC
  Administered 2019-10-17: 8.6 mg via ORAL
  Filled 2019-10-17: qty 1

## 2019-10-17 MED ORDER — POLYETHYLENE GLYCOL 3350 17 G PO PACK
17.0000 g | PACK | Freq: Two times a day (BID) | ORAL | Status: DC
  Administered 2019-10-17 (×2): 17 g via ORAL
  Filled 2019-10-17 (×2): qty 1

## 2019-10-17 NOTE — Progress Notes (Signed)
PROGRESS NOTE   Shelley Hines  VQQ:[id number]    DOB: May 03, 1966    DOA: 10/14/2019  PCP: Lavella Lemons, PA   I have briefly reviewed patients previous medical records in [facility].  Chief Complaint:   Chief Complaint  Patient presents with  . Abdominal Pain    Brief Narrative:  54 year old female, independent and reportedly lives with her friend, PMH of depression, prior alcohol dependence, chronic constipation,?  Rectal prolapse, hypothyroidism, initially presented to [facility] ED on 10/14/2019 due to 1 week history of abdominal pain.  Evaluation revealed acute blood loss anemia (hemoglobin dropped from baseline of 13.1-8.2), negative Covid testing, CT abdomen and pelvis with contrast confirmed large left retroperitoneal hemorrhage concerning to be coming from ruptured or avulsed left ovarian vein.  No clear arterial bleeding source noted.  Case was discussed with general surgery and OB/GYN who had no surgical interventions to offer and recommended IR consultation for possible embolization.  She was transferred to [facility] overnight 2/11.  IR consulted, underwent CTA abdomen and pelvis which seemed to show infrarenal abdominal aorta as source of bleeding, vascular surgery was consulted and SP aortic stent x2 for repair of infrarenal aortic injury of unclear etiology on 2/12.  Improving.   Assessment & Plan:  Active Problems:   Retroperitoneal hemorrhage   Large left retroperitoneal hemorrhage due to infrarenal aortic injury of unclear etiology: OB/GYN and general surgery did not have any surgical recommendations. IR consulted, underwent CTA abdomen and pelvis which seemed to show infrarenal abdominal aorta as source of bleeding, vascular surgery was consulted and SP aortic stent x2 for repair of infrarenal aortic injury of unclear etiology on 2/12. VVS follow-up appreciated, doing well, left groin soft without hematoma, palpable lower extremity pulses,  hemoglobin stable, starting aspirin 81 mg daily from tomorrow, mobilize, follow-up with Dr. Carlis Abbott in 4 weeks with CTA-their office will arrange appointment.  Remained stable  Acute blood loss anemia: Due to retroperitoneal hemorrhage.  Hemoglobin 13.1 on 08/08/2019.  Presented with hemoglobin of 8.2.  S/p total 2 units PRBC transfusion this admission.  Hemoglobin had dropped to a low of 6.8 up to 9.3 today.  Hemoglobin has dropped further to 8.1, suspect equilibrating.  However will recheck hemoglobin later this evening and transfuse if less than 8 g per DL.  Hypokalemia: Replaced.  Magnesium normal.  Alcohol dependence: Patient reports that she quit several months ago when I have no reason not to believe her.  DC CIWA protocol.  Elevated blood pressure: Likely stress response related to pain.  As needed IV labetalol.  Acceptable BPs.  Depression: Stable.  Continue prior home medications.  Bupropion, buspirone,.    Leukocytosis: Likely stress response.  Although CT abdomen suggested pyelonephritis, no urinary symptoms and UA not supportive of UTI.  Leukocytosis slowly improving.  Follow CBC in a.m.  Chronic constipation: Patient states that she can only use MiraLAX, started same.  Still no BM.  Willing to increase MiraLAX to 17 g twice daily and trial of senna.  Has been drinking prune juice and raisin cereal.  Mobilize.  Body mass index is 21.93 kg/m.   DVT prophylaxis: SCDs Code Status: Full Family Communication: None at bedside.  Discussed in detail with patient's daughter via phone on 2/12, updated care and answered questions. Disposition:  . Patient came from: Home           . Anticipated d/c place: Home . Barriers to d/c: Patient with large left RP hematoma due  to infrarenal aortic injury, significant ABLA, required extensive evaluation and intervention as noted above, given how serious her condition was, dropping her hemoglobin that warrants close inpatient monitoring, will observe  additional night and if remains stable then consider discharge home on 2/15.     Consultants:   IR Vascular surgery  Procedures:   On Nov 08, 2022  1. Percutaneous access and closure of the left common femoral artery for delivery of endograft 2. Aortogram 3. Endovascular repair of the infrarenal abdominalaorta by deployment of aortic stent graftx2 (23 mm x 3.3 cm Gore Aortic Extender Endoprothesis)   Right IJ central line 11-08-2022.  Antimicrobials:   None   Subjective:  Interviewed and examined with patient's female RN in room.  Abdomen feels "stiff" and thinks that this is due to having no BMs.  Passing flatus.  No nausea or vomiting.  Has tried prune juice and raisin cereal.  Minimal dry cough without dyspnea.  Objective:   Vitals:   10/16/19 2006 10/16/19 2300 10/17/19 0519 10/17/19 0800  BP:  140/81 (!) 153/80 (!) 151/80  Pulse:  90  84  Resp:  (!) 23  (!) 21  Temp: 99 F (37.2 C) 98.7 F (37.1 C) 98.8 F (37.1 C) (!) 97.4 F (36.3 C)  TempSrc: Oral Oral Oral Axillary  SpO2:  100% 99% 99%  Weight:      Height:        General exam: Young female, moderately built and nourished sitting up comfortably in bed without distress. Respiratory system: Clear to auscultation.  No increased work of breathing. Cardiovascular system: S1 and S2 heard, RRR.  No JVD, murmurs or pedal edema.  Telemetry personally reviewed: Sinus rhythm. Gastrointestinal system: Abdomen remains nondistended, soft and nontender.  No organomegaly or masses appreciated.  Normal bowel sounds heard. Central nervous system: Alert and oriented. No focal neurological deficits. Extremities: Symmetric 5 x 5 power.  Peripheral pulses symmetrically felt in bilateral lower extremities. Skin: No rashes, lesions or ulcers Psychiatry: Judgement and insight appear normal. Mood & affect appropriate.     Data Reviewed:   I have personally reviewed following labs and imaging studies   CBC: Recent Labs  Lab  11-09-19 0854 Nov 09, 2019 1947 2019-11-09 2215 10/16/19 0730 10/17/19 0337  WBC 24.5*  --  23.4* 20.7* 18.8*  NEUTROABS 22.0*  --  19.3* 19.3*  --   HGB 7.7*   < > 10.0* 9.3* 8.1*  HCT 23.6*   < > 28.9* 27.6* 23.5*  MCV 91.5  --  87.6 89.9 89.0  PLT 322  --  215 226 210   < > = values in this interval not displayed.    Basic Metabolic Panel: Recent Labs  Lab 09-Nov-2019 0148 Nov 09, 2019 0254 Nov 09, 2019 2215 10/16/19 0730 10/17/19 0337  NA  --    < > 138 137 138  K  --    < > 5.0 4.4 4.0  CL  --    < > 104 103 106  CO2  --    < > 24 25 24   GLUCOSE  --    < > 122* 135* 92  BUN  --    < > 25* 22* 13  CREATININE  --    < > 1.13* 0.99 0.75  CALCIUM  --    < > 8.5* 8.6* 8.3*  MG 2.2  --  2.4  --   --   PHOS 5.9*  --   --   --   --    < > =  values in this interval not displayed.    Liver Function Tests: Recent Labs  Lab 10/14/19 1341 10/16/19 0730  AST 18 17  ALT 19 16  ALKPHOS 73 64  BILITOT 1.0 1.0  PROT 6.2* 5.6*  ALBUMIN 3.2* 2.5*    CBG: No results for input(s): GLUCAP in the last 168 hours.  Microbiology Studies:   Recent Results (from the past 240 hour(s))  Respiratory Panel by RT PCR (Flu A&B, Covid) - Nasopharyngeal Swab     Status: None   Collection Time: 10/14/19  4:52 PM   Specimen: Nasopharyngeal Swab  Result Value Ref Range Status   SARS Coronavirus 2 by RT PCR NEGATIVE NEGATIVE Final    Comment: (NOTE) SARS-CoV-2 target nucleic acids are NOT DETECTED. The SARS-CoV-2 RNA is generally detectable in upper respiratoy specimens during the acute phase of infection. The lowest concentration of SARS-CoV-2 viral copies this assay can detect is 131 copies/mL. A negative result does not preclude SARS-Cov-2 infection and should not be used as the sole basis for treatment or other patient management decisions. A negative result may occur with  improper specimen collection/handling, submission of specimen other than nasopharyngeal swab, presence of viral mutation(s)  within the areas targeted by this assay, and inadequate number of viral copies (<131 copies/mL). A negative result must be combined with clinical observations, patient history, and epidemiological information. The expected result is Negative. Fact Sheet for Patients:  https://www.moore.com/ Fact Sheet for Healthcare Providers:  https://www.young.biz/ This test is not yet ap proved or cleared by the [place] FDA and  has been authorized for detection and/or diagnosis of SARS-CoV-2 by FDA under an Emergency Use Authorization (EUA). This EUA will remain  in effect (meaning this test can be used) for the duration of the COVID-19 declaration under Section 564(b)(1) of the Act, 21 U.S.C. section 360bbb-3(b)(1), unless the authorization is terminated or revoked sooner.    Influenza A by PCR NEGATIVE NEGATIVE Final   Influenza B by PCR NEGATIVE NEGATIVE Final    Comment: (NOTE) The Xpert Xpress SARS-CoV-2/FLU/RSV assay is intended as an aid in  the diagnosis of influenza from Nasopharyngeal swab specimens and  should not be used as a sole basis for treatment. Nasal washings and  aspirates are unacceptable for Xpert Xpress SARS-CoV-2/FLU/RSV  testing. Fact Sheet for Patients: https://www.moore.com/ Fact Sheet for Healthcare Providers: https://www.young.biz/ This test is not yet approved or cleared by the [place] FDA and  has been authorized for detection and/or diagnosis of SARS-CoV-2 by  FDA under an Emergency Use Authorization (EUA). This EUA will remain  in effect (meaning this test can be used) for the duration of the  Covid-19 declaration under Section 564(b)(1) of the Act, 21  U.S.C. section 360bbb-3(b)(1), unless the authorization is  terminated or revoked. Performed at [facility], [address], [place], [place] [address]      Radiology Studies:  No results found.   Scheduled Meds:    . [START ON 10/18/2019] aspirin EC  81 mg Oral Q0600  . buPROPion  450 mg Oral Daily  . busPIRone  5 mg Oral BID  . Chlorhexidine Gluconate Cloth  6 each Topical Daily  . folic acid  1 mg Oral Daily  . multivitamin with minerals  1 tablet Oral Daily  . pantoprazole  40 mg Oral Daily  . polyethylene glycol  17 g Oral BID  . senna  1 tablet Oral Daily  . thiamine  100 mg Oral Daily   Or  .  thiamine  100 mg Intravenous Daily    Continuous Infusions:   . sodium chloride       LOS: 3 days     Marcellus Scott, MD, [place], [facility]. [facility]    To contact the attending provider between 7A-7P or the covering provider during after hours 7P-7A, please log into the web site [email or web address] and access using universal [place] password for that web site. If you do not have the password, please call the hospital operator.  10/17/2019, 12:55 PM

## 2019-10-17 NOTE — Anesthesia Postprocedure Evaluation (Signed)
Anesthesia Post Note  Patient: Shelley Hines  Procedure(s) Performed: ABDOMINAL AORTIC ENDOVASCULAR STENT GRAFT (N/A Abdomen)     Patient location during evaluation: PACU Anesthesia Type: General Level of consciousness: awake and alert Pain management: pain level controlled Vital Signs Assessment: post-procedure vital signs reviewed and stable Respiratory status: spontaneous breathing, nonlabored ventilation, respiratory function stable and patient connected to nasal cannula oxygen Cardiovascular status: blood pressure returned to baseline and stable Postop Assessment: no apparent nausea or vomiting Anesthetic complications: no    Last Vitals:  Vitals:   10/17/19 0519 10/17/19 0800  BP: (!) 153/80 (!) 151/80  Pulse:  84  Resp:  (!) 21  Temp: 37.1 C (!) 36.3 C  SpO2: 99% 99%    Last Pain:  Vitals:   10/17/19 0800  TempSrc: Axillary  PainSc: 0-No pain                 Silvina Hackleman

## 2019-10-17 NOTE — Progress Notes (Signed)
  Progress Note    10/17/2019 11:30 AM   Subjective:  Up walking around room.  No distress.   Vitals:   10/17/19 0519 10/17/19 0800  BP: (!) 153/80 (!) 151/80  Pulse:  84  Resp:  (!) 21  Temp: 98.8 F (37.1 C) (!) 97.4 F (36.3 C)  SpO2: 99% 99%    Physical Exam: Cardiac:  regular Lungs:  Non labored Incisions:  Left groin soft without hematoma Extremities:  Easily palpable pedal pulses bilaterally Abdomen:  Soft, NT/ND  CBC    Component Value Date/Time   WBC 18.8 (H) 10/17/2019 0337   RBC 2.64 (L) 10/17/2019 0337   HGB 8.1 (L) 10/17/2019 0337   HCT 23.5 (L) 10/17/2019 0337   PLT 210 10/17/2019 0337   MCV 89.0 10/17/2019 0337   MCH 30.7 10/17/2019 0337   MCHC 34.5 10/17/2019 0337   RDW 14.7 10/17/2019 0337   LYMPHSABS 0.7 10/16/2019 0730   MONOABS 0.5 10/16/2019 0730   EOSABS 0.0 10/16/2019 0730   BASOSABS 0.0 10/16/2019 0730    BMET    Component Value Date/Time   NA 138 10/17/2019 0337   K 4.0 10/17/2019 0337   CL 106 10/17/2019 0337   CO2 24 10/17/2019 0337   GLUCOSE 92 10/17/2019 0337   BUN 13 10/17/2019 0337   CREATININE 0.75 10/17/2019 0337   CALCIUM 8.3 (L) 10/17/2019 0337   GFRNONAA >60 10/17/2019 0337   GFRAA >60 10/17/2019 0337    INR    Component Value Date/Time   INR 1.2 10/15/2019 2215     Intake/Output Summary (Last 24 hours) at 10/17/2019 1130 Last data filed at 10/17/2019 0800 Gross per 24 hour  Intake 580 ml  Output 600 ml  Net -20 ml     Assessment:  54 y.o. female is s/p:  1.  Percutaneous access and closure of the left common femoral artery for delivery of endograft 2.  Aortogram 3.  Endovascular repair of the infrarenal abdominal aorta by deployment of aortic stent graft x2 (23 mm x 3.3 cm Gore Aortic Extender Endoprothesis)    Now POD#2.  Left groin continues to look good.  Palpable left pedal pulse.  Hgb 9.3 --> 8.1  No significant drop like Friday.  Hemodynamically stable.  No further intervention planned from [place]  at this point.  Transfuse if needed.  Cephus Shelling, MD [facility] Office: [phone]

## 2019-10-17 NOTE — Plan of Care (Signed)
  Problem: Education: Goal: Knowledge of General Education information will improve Description Including pain rating scale, medication(s)/side effects and non-pharmacologic comfort measures Outcome: Progressing   Problem: Health Behavior/Discharge Planning: Goal: Ability to manage health-related needs will improve Outcome: Progressing   

## 2019-10-18 ENCOUNTER — Encounter (HOSPITAL_COMMUNITY): Payer: Self-pay

## 2019-10-18 LAB — BPAM RBC
Blood Product Expiration Date: 202103082359
Blood Product Expiration Date: 202103082359
Blood Product Expiration Date: 202103162359
Blood Product Expiration Date: 202103162359
Blood Product Expiration Date: 202103172359
Blood Product Expiration Date: 202103172359
Blood Product Expiration Date: 202103172359
Blood Product Expiration Date: 202103172359
Blood Product Expiration Date: 202103172359
ISSUE DATE / TIME: 202102112048
ISSUE DATE / TIME: 202102121455
ISSUE DATE / TIME: 202102121912
ISSUE DATE / TIME: 202102121912
ISSUE DATE / TIME: 202102121912
ISSUE DATE / TIME: 202102121912
Unit Type and Rh: 6200
Unit Type and Rh: 6200
Unit Type and Rh: 6200
Unit Type and Rh: 6200
Unit Type and Rh: 6200
Unit Type and Rh: 6200
Unit Type and Rh: 6200
Unit Type and Rh: 6200
Unit Type and Rh: 6200

## 2019-10-18 LAB — TYPE AND SCREEN
ABO/RH(D): A POS
ABO/RH(D): A POS
Antibody Screen: NEGATIVE
Antibody Screen: NEGATIVE
Unit division: 0
Unit division: 0
Unit division: 0
Unit division: 0
Unit division: 0
Unit division: 0
Unit division: 0
Unit division: 0
Unit division: 0

## 2019-10-18 LAB — BASIC METABOLIC PANEL
Anion gap: 11 (ref 5–15)
BUN: 10 mg/dL (ref 6–20)
CO2: 25 mmol/L (ref 22–32)
Calcium: 8.4 mg/dL — ABNORMAL LOW (ref 8.9–10.3)
Chloride: 101 mmol/L (ref 98–111)
Creatinine, Ser: 0.77 mg/dL (ref 0.44–1.00)
GFR calc Af Amer: >60 mL/min (ref >60)
GFR calc non Af Amer: >60 mL/min (ref >60)
Glucose, Bld: 98 mg/dL (ref 70–99)
Potassium: 3.9 mmol/L (ref 3.5–5.1)
Sodium: 137 mmol/L (ref 135–145)

## 2019-10-18 LAB — CBC
HCT: 26.8 % — ABNORMAL LOW (ref 36.0–46.0)
Hemoglobin: 8.8 g/dL — ABNORMAL LOW (ref 12.0–15.0)
MCH: 29.6 pg (ref 26.0–34.0)
MCHC: 32.8 g/dL (ref 30.0–36.0)
MCV: 90.2 fL (ref 80.0–100.0)
Platelets: 257 10*3/uL (ref 150–400)
RBC: 2.97 MIL/uL — ABNORMAL LOW (ref 3.87–5.11)
RDW: 14.6 % (ref 11.5–15.5)
WBC: 11.7 10*3/uL — ABNORMAL HIGH (ref 4.0–10.5)
nRBC: 0 % (ref 0.0–0.2)

## 2019-10-18 MED ORDER — POLYETHYLENE GLYCOL 3350 17 G PO PACK: 17.0000 g | PACK | Freq: Two times a day (BID) | ORAL | 0 refills | Status: DC

## 2019-10-18 MED ORDER — ASPIRIN 81 MG PO TBEC: 81.0000 mg | DELAYED_RELEASE_TABLET | Freq: Every day | ORAL | 0 refills | Status: AC

## 2019-10-18 MED ORDER — BUPROPION HCL ER (XL) 150 MG PO TB24: 450.0000 mg | ORAL_TABLET | Freq: Every day | ORAL | Status: DC

## 2019-10-18 MED ORDER — ACETAMINOPHEN 325 MG PO TABS: 650.0000 mg | ORAL_TABLET | Freq: Four times a day (QID) | ORAL | Status: DC | PRN

## 2019-10-18 MED FILL — Hydromorphone HCl Inj 2 MG/ML: INTRAMUSCULAR | Qty: 1 | Status: AC

## 2019-10-18 NOTE — Discharge Summary (Signed)
Physician Discharge Summary  Shelley Hines ZOX:[id number] DOB: 07/30/1966  PCP: Lovey Newcomer, PA  Admitted from: Home Discharged to: Home  Admit date: 10/14/2019 Discharge date: 10/18/2019  Recommendations for Outpatient Follow-up:   Follow-up Information    Cephus Shelling, MD In 4 weeks.   Specialty: Vascular Surgery Why: Office will call you to arrange your appt (sent) Contact information: [address] [place] [address] [phone]        Lovey Newcomer, [place]. Schedule an appointment as soon as possible for a visit in 1 week(s).   Specialty: Physician Assistant Why: To be seen with repeat labs (CBC & BMP). Contact information: [address] [place] [address] [phone]            Home Health: None Equipment/Devices: None  Discharge Condition: Improved and stable CODE STATUS: Full Diet recommendation: Heart healthy diet.  Discharge Diagnoses:  Active Problems:   Retroperitoneal hemorrhage   Brief Summary: 54 year old female, independent and reportedly lives with her friend, PMH of depression, prior alcohol dependence, chronic constipation,?  Rectal prolapse, hypothyroidism, initially presented to [facility] ED on 10/14/2019 due to 1 week history of abdominal pain.  Evaluation revealed acute blood loss anemia (hemoglobin dropped from baseline of 13.1-8.2), negative Covid testing, CT abdomen and pelvis with contrast confirmed large left retroperitoneal hemorrhage concerning to be coming from ruptured or avulsed left ovarian vein.  No clear arterial bleeding source noted.  Case was discussed with general surgery and OB/GYN who had no surgical interventions to offer and recommended IR consultation for possible embolization.  She was transferred to [facility] overnight 2/11.  IR consulted, underwent CTA abdomen and pelvis which seemed to show infrarenal abdominal aorta as source of bleeding, vascular surgery was consulted and SP  aortic stent x2 for repair of infrarenal aortic injury of unclear etiology on 2/12.    Assessment & Plan:   Large left retroperitoneal hemorrhage due to infrarenal aortic injury of unclear etiology: OB/GYN and general surgery did not have any surgical recommendations. IR consulted, underwent CTA abdomen and pelvis which seemed to show infrarenal abdominal aorta as source of bleeding, vascular surgery was consulted and SP aortic stent x2 for repair of infrarenal aortic injury of unclear etiology on 2/12. VVS follow-up appreciated, doing well, left groin soft without hematoma, palpable lower extremity pulses, hemoglobin stable, starting aspirin 81 mg daily from 2/15, mobilized, follow-up with Dr. Chestine Spore in 4 weeks with CTA-their office will arrange appointment.    Dr. Chestine Spore has seen today and has cleared her for discharge home.  Etiology of her bleeding remains unclear and may need to be further evaluated as outpatient.  As per CT reports, multiple abdominal arteries had abnormal/beaded appearance and concern for possible fibromuscular dysplasia, again needs to be evaluated outpatient.  Acute blood loss anemia: Due to retroperitoneal hemorrhage.  Hemoglobin 13.1 on 08/08/2019.  Presented with hemoglobin of 8.2.  S/p total 2 units PRBC transfusion this admission.    Post procedure, hemoglobin has remained stable, 8.8 on day of discharge.  Follow CBC closely as outpatient.  Hypokalemia: Replaced.  Magnesium normal.  Alcohol dependence: Patient reports that she quit several months ago when I have no reason not to believe her.  DC CIWA protocol.  Elevated blood pressure: Likely stress response related to pain.  Controlled.  No antihypertensives at discharge.  Depression & anxiety: Stable.  Continue prior home medications.  Bupropion, buspirone,.    Leukocytosis: Likely stress response.  Although CT abdomen  suggested pyelonephritis, no urinary symptoms and UA not supportive of UTI.  Leukocytosis  slowly improving and have almost normalized on day of discharge.  Chronic constipation: Patient reports having a BM but wishes to only continue MiraLAX at discharge and no senna.  Does have issues with rectal prolapse.  Outpatient follow-up.  Body mass index is 21.93 kg/m.  Pulmonary nodules: Noted on CT abdomen 10/14/2019.  Outpatient follow-up and evaluation as deemed necessary and as per radiologist recommendation.   Consultants:   IR Vascular surgery  Procedures:   On 2/12  1. Percutaneous access and closure of the left common femoral artery for delivery of endograft 2. Aortogram 3. Endovascular repair of the infrarenal abdominalaorta by deployment of aortic stent graftx2 (23 mm x 3.3 cm Gore Aortic Extender Endoprothesis)  Right IJ central line 2/12> 2/14.   Discharge Instructions  Discharge Instructions    Call MD for:  difficulty breathing, headache or visual disturbances   Complete by: As directed    Call MD for:  extreme fatigue   Complete by: As directed    Call MD for:  persistant dizziness or light-headedness   Complete by: As directed    Call MD for:  persistant nausea and vomiting   Complete by: As directed    Call MD for:  redness, tenderness, or signs of infection (pain, swelling, redness, odor or green/yellow discharge around incision site)   Complete by: As directed    Call MD for:  severe uncontrolled pain   Complete by: As directed    Call MD for:  temperature >100.4   Complete by: As directed    Diet - low sodium heart healthy   Complete by: As directed    Increase activity slowly   Complete by: As directed        Medication List    STOP taking these medications   cefPROZIL 250 MG tablet Commonly known as: CEFZIL   psyllium 58.6 % powder Commonly known as: METAMUCIL     TAKE these medications   acetaminophen 325 MG tablet Commonly known as: TYLENOL Take 2 tablets (650 mg total) by mouth every 6 (six) hours as needed for  mild pain, moderate pain or fever (or temp >/= 101 F).   aspirin 81 MG EC tablet Take 1 tablet (81 mg total) by mouth daily at 6 (six) AM. Start taking on: October 19, 2019   buPROPion 150 MG 24 hr tablet Commonly known as: WELLBUTRIN XL Take 3 tablets (450 mg total) by mouth daily. For depression   busPIRone 5 MG tablet Commonly known as: BUSPAR Take 1 tablet (5 mg total) by mouth 2 (two) times daily. For anxiety   multivitamin with minerals Tabs tablet Take 1 tablet by mouth daily.   polyethylene glycol 17 g packet Commonly known as: MIRALAX / GLYCOLAX Take 17 g by mouth 2 (two) times daily.      Allergies  Allergen Reactions  . Codeine Hives, Itching and Swelling  . Fentanyl Citrate Itching  . Hydrocodone-Acetaminophen Hives, Itching and Swelling  . Oxycodone-Acetaminophen Hives, Itching, Nausea And Vomiting and Swelling  . Penicillins Hives, Itching and Swelling  . Tramadol Hives, Itching and Swelling      Procedures/Studies: CT Abdomen Pelvis W Contrast  Result Date: 10/14/2019 CLINICAL DATA:  54 year old with acute abdominal pain. EXAM: CT ABDOMEN AND PELVIS WITH CONTRAST TECHNIQUE: Multidetector CT imaging of the abdomen and pelvis was performed using the standard protocol following bolus administration of intravenous contrast. CONTRAST:  OMNIPAQUE  IOHEXOL 300 MG/ML  SOLN COMPARISON:  None. FINDINGS: Lower chest: No pleural effusions. Triangular pleural-based density at the left lung base on sequence 5 image 38 measures roughly 4 mm and indeterminate. Nodule along a left lung fissure on sequence 5, image 13 measuring 5 mm. Hepatobiliary: Multiple small hypodense structures in the liver that are too small to definitively characterize. There is a 1.7 cm hypodensity in the left hepatic lobe that is suggestive for a cyst. Normal appearance of the gallbladder. Portal venous system is patent. No biliary dilatation. Pancreas: Unremarkable. No pancreatic ductal dilatation  or surrounding inflammatory changes. Spleen: Normal in size without focal abnormality. Adrenals/Urinary Tract: Normal appearance of the adrenal gland. The left kidney is displaced anteriorly and has mild left hydronephrosis. The left kidney is displaced from a larger retroperitoneal hemorrhage. In addition there are wedge-shaped hypoechoic areas along the left kidney upper pole and concern for focal inflammation or infection. There is decreased attenuation along the superolateral aspect of the right kidney raising concern for edema or inflammation in this area. Negative for right hydronephrosis. 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 Normal appearance of the urinary bladder. Stomach/Bowel: Moderate amount of stool in the colon. Duodenum and small bowel are displaced anteriorly from a large amount of retroperitoneal blood situated anterior to the aorta and inferior vena cava. Normal appearance of the stomach with a small hiatal hernia. No evidence for bowel obstruction. Vascular/Lymphatic: Normal caliber of the abdominal aorta without dissection. Celiac trunk, SMA, bilateral renal arteries and IMA are patent. Normal caliber of the iliac arteries without significant atherosclerotic disease or stenosis. There is a large left retroperitoneal hemorrhage with evidence for a pseudoaneurysm or focal contrast extravasation just left of the abdominal aorta on sequence 2, image 37. This area of contrast extravasation measures 2.8 x 2.1 x 3.1 cm. This area is just caudal to the left renal vein and the origin of the left ovarian vein. There is contrast within the left ovarian vein distal to this area of contrast extravasation. This area of contrast extravasation appears to be contiguous with the proximal left ovarian vein. Bilateral renal veins are patent. Suprarenal IVC is patent. Normal caliber of the iliac veins. Reproductive: Uterus and bilateral adnexa are unremarkable. Other:  Large left retroperitoneal hemorrhage that measures roughly 18.5 x 10.7 x 10.5 cm. In addition, there is retroperitoneal blood crossing the midline below the SMA. There is blood posterior to the duodenum. Small amount of fluid or blood in the lower posterior abdomen. Negative for free air. Musculoskeletal: No acute bone abnormality. IMPRESSION: 1. Large left retroperitoneal hemorrhage. There is a focus of contrast extravasation and active bleeding just left of the abdominal aorta. This contrast extravasation appears to be contiguous with the left ovarian vein and raises concern for rupture or avulsion of the left ovarian vein. No clear evidence for an arterial bleeding source. 2. Displacement of the left kidney with at least mild left hydronephrosis. Left kidney is displaced from the large retroperitoneal hemorrhage. 3. Areas of decreased attenuation involving upper pole of both kidneys, right side greater than left. Findings raise concern for areas of pyelonephritis. Cannot exclude some scarring particularly in the right kidney upper pole. 4. Small pulmonary nodules at the left lung base are indeterminate. Largest pulmonary nodule measures up to 5 mm. No follow-up needed if patient is low-risk. Non-contrast chest CT can be considered in 12 months if patient is high-risk. This recommendation follows the consensus statement: Guidelines for Management of Incidental  Pulmonary Nodules Detected on CT Images: From the Fleischner Society 2017; Radiology 2017; [phone]. These results were called by telephone at the time of interpretation on 10/14/2019 at 3:31 pm to provider [facility] , who verbally acknowledged these results. Electronically Signed   By: Richarda Overlie M.D.   On: 10/14/2019 16:11   IR Angiogram Renal Left Selective  Result Date: 10/16/2019 INDICATION: Large retroperitoneal hemorrhage with CTA demonstrating pseudoaneurysm to the left of the infrarenal abdominal aorta and increasing retroperitoneal  blood. Bleeding appears to be in close proximity to the expected origin the left ovarian artery by CTA but also the left ovarian vein. EXAM: 1. ULTRASOUND GUIDANCE FOR VASCULAR ACCESS OF THE RIGHT COMMON FEMORAL ARTERY 2. ABDOMINAL AORTOGRAM 3. SELECTIVE ARTERIOGRAPHY OF THE LEFT RENAL ARTERY 4. ULTRASOUND GUIDANCE FOR VASCULAR ACCESS OF THE RIGHT COMMON FEMORAL VEIN 5. IVC VENOGRAPHY 6. SELECTIVE VENOGRAPHY OF THE LEFT RENAL VEIN MEDICATIONS: No additional medications. ANESTHESIA/SEDATION: Moderate (conscious) sedation was employed during this procedure. A total of Versed 6.0 mg and 1 mg IV dilaudid was administered intravenously. Moderate Sedation Time: 125 minutes. The patient's level of consciousness and vital signs were monitored continuously by radiology nursing throughout the procedure under my direct supervision. CONTRAST:  200 mL Visipaque 320 FLUOROSCOPY TIME:  Fluoroscopy Time: 30 minutes and 54 seconds. 668.6 mGy. COMPLICATIONS: None immediate. PROCEDURE: Informed consent was obtained from the patient following explanation of the procedure, risks, benefits and alternatives. The patient understands, agrees and consents for the procedure. All questions were addressed. A time out was performed prior to the initiation of the procedure. Maximal barrier sterile technique utilized including caps, mask, sterile gowns, sterile gloves, large sterile drape, hand hygiene, and chlorhexidine prep. Ultrasound of the right groin was performed to confirm patency of the right common femoral artery and right common femoral vein. During the procedure local anesthesia was provided with 1% lidocaine. Initial access of the right common femoral artery was performed under direct ultrasound guidance with a 21 gauge needle and micropuncture set. Ultrasound image documentation was performed. After access, a 5 [place] vascular sheath was placed. A 5 French pigtail catheter was then advanced into the abdominal aorta and abdominal  aortography performed with the pigtail catheter centered just above the level of the renal arteries. Additional magnified oblique and lateral projection arteriography was also performed over the mid abdominal aorta with injection of the pigtail catheter. The pigtail catheter was exchanged for a 5 [place] Cobra catheter. This was used to selectively catheterize the left renal artery. Selective left renal arteriography was performed. The Cobra catheter was exchanged for a 5 [place] Sos catheter. This was used to probe the mid abdominal aorta with contrast injection performed at multiple levels. A coaxial lantern microcatheter and small caliber guidewire were also advanced through the Sos catheter and used to probe the abdominal aorta. Access of the right common femoral vein was then performed under direct ultrasound guidance with a 21 gauge needle and micropuncture set. Ultrasound image documentation was performed. A 5 French vascular sheath was placed. A 5 French pigtail catheter was advanced into the lower IVC and IVC venography performed. The pigtail catheter was exchanged for a 5 [place] Cobra catheter was used to selectively catheterize the left renal vein. Guidewire access was performed further to the level of the left kidney and a 5 [place] KMP catheter further advanced into the left renal vein. Selective left renal venography was then performed. Closure at the arteriotomy site was performed with the Cordis ExoSeal device. The  femoral venous sheath was removed and manual compression held. FINDINGS: Abdominal aortogram demonstrates a normal caliber abdominal aorta without evidence of aneurysm, stenosis or dissection. Bilateral single renal arteries appear normally patent. There is focal contrast extravasation along the left side of the infrarenal aorta filling a bilobed shaped pseudoaneurysm as depicted by CTA and faint filling of a contiguous descending vessel felt to most likely represent an abnormal left ovarian  artery. Additional oblique magnified arteriography confirms similar findings with essentially pinhole arterial extravasation into the pseudoaneurysm. The expected region of contrast extravasation was interrogated with a 5 French catheter as well as a microcatheter. Despite being able to partially visualize contrast extravasation at times, the exact site of extravasation could not be localized or catheterized to allow transcatheter treatment. Lumbar arteries were well delineated and the source of bleeding does not appear to be from a left-sided lumbar artery. Selective left renal arteriography was also performed to exclude a renal artery source for bleeding. A single left renal artery and its branches are well delineated and demonstrate normal patency without evidence bleeding, pseudoaneurysm or other abnormality. Decision was made to also study venous structures and exclude a venous source for bleeding. IVC venography demonstrates normal patency of the inferior vena cava. Mild mass effect on the infrarenal segment of the inferior vena cava is likely on the basis of the large amount of retroperitoneal hemorrhage present. No evidence of thrombus. Renal vein inflow is identified bilaterally. Selective left renal venography was also performed demonstrating normal patency of the left renal vein and intrarenal venous branches. There is no reflux into the left ovarian vein, evidence of AV malformation or contrast extravasation. IMPRESSION: 1. Arterial source of active bleeding having the appearance of a left-sided infrarenal pinhole bleed near the expected origin of the left ovarian artery filling a bilobed pseudoaneurysm with faint opacification of a contiguous left ovarian artery descending into the pelvis. Source of bleeding is located approximately 2 cm inferior to the left renal artery orifice. 2. Normal IVC and left renal venography. No evidence of venous source for bleeding. 3. Findings were reviewed directly with  Dr. Chestine Spore who was present for much of the procedure and will be planning endovascular repair of the aorta to treat the site of active hemorrhage. Electronically Signed   By: Irish Lack M.D.   On: 10/16/2019 09:46   IR Venocavagram Ivc  Result Date: 10/16/2019 INDICATION: Large retroperitoneal hemorrhage with CTA demonstrating pseudoaneurysm to the left of the infrarenal abdominal aorta and increasing retroperitoneal blood. Bleeding appears to be in close proximity to the expected origin the left ovarian artery by CTA but also the left ovarian vein. EXAM: 1. ULTRASOUND GUIDANCE FOR VASCULAR ACCESS OF THE RIGHT COMMON FEMORAL ARTERY 2. ABDOMINAL AORTOGRAM 3. SELECTIVE ARTERIOGRAPHY OF THE LEFT RENAL ARTERY 4. ULTRASOUND GUIDANCE FOR VASCULAR ACCESS OF THE RIGHT COMMON FEMORAL VEIN 5. IVC VENOGRAPHY 6. SELECTIVE VENOGRAPHY OF THE LEFT RENAL VEIN MEDICATIONS: No additional medications. ANESTHESIA/SEDATION: Moderate (conscious) sedation was employed during this procedure. A total of Versed 6.0 mg and 1 mg IV dilaudid was administered intravenously. Moderate Sedation Time: 125 minutes. The patient's level of consciousness and vital signs were monitored continuously by radiology nursing throughout the procedure under my direct supervision. CONTRAST:  200 mL Visipaque 320 FLUOROSCOPY TIME:  Fluoroscopy Time: 30 minutes and 54 seconds. 668.6 mGy. COMPLICATIONS: None immediate. PROCEDURE: Informed consent was obtained from the patient following explanation of the procedure, risks, benefits and alternatives. The patient understands, agrees and consents for the procedure.  All questions were addressed. A time out was performed prior to the initiation of the procedure. Maximal barrier sterile technique utilized including caps, mask, sterile gowns, sterile gloves, large sterile drape, hand hygiene, and chlorhexidine prep. Ultrasound of the right groin was performed to confirm patency of the right common femoral artery and  right common femoral vein. During the procedure local anesthesia was provided with 1% lidocaine. Initial access of the right common femoral artery was performed under direct ultrasound guidance with a 21 gauge needle and micropuncture set. Ultrasound image documentation was performed. After access, a 5 [place] vascular sheath was placed. A 5 French pigtail catheter was then advanced into the abdominal aorta and abdominal aortography performed with the pigtail catheter centered just above the level of the renal arteries. Additional magnified oblique and lateral projection arteriography was also performed over the mid abdominal aorta with injection of the pigtail catheter. The pigtail catheter was exchanged for a 5 [place] Cobra catheter. This was used to selectively catheterize the left renal artery. Selective left renal arteriography was performed. The Cobra catheter was exchanged for a 5 [place] Sos catheter. This was used to probe the mid abdominal aorta with contrast injection performed at multiple levels. A coaxial lantern microcatheter and small caliber guidewire were also advanced through the Sos catheter and used to probe the abdominal aorta. Access of the right common femoral vein was then performed under direct ultrasound guidance with a 21 gauge needle and micropuncture set. Ultrasound image documentation was performed. A 5 French vascular sheath was placed. A 5 French pigtail catheter was advanced into the lower IVC and IVC venography performed. The pigtail catheter was exchanged for a 5 [place] Cobra catheter was used to selectively catheterize the left renal vein. Guidewire access was performed further to the level of the left kidney and a 5 [place] KMP catheter further advanced into the left renal vein. Selective left renal venography was then performed. Closure at the arteriotomy site was performed with the Cordis ExoSeal device. The femoral venous sheath was removed and manual compression held. FINDINGS:  Abdominal aortogram demonstrates a normal caliber abdominal aorta without evidence of aneurysm, stenosis or dissection. Bilateral single renal arteries appear normally patent. There is focal contrast extravasation along the left side of the infrarenal aorta filling a bilobed shaped pseudoaneurysm as depicted by CTA and faint filling of a contiguous descending vessel felt to most likely represent an abnormal left ovarian artery. Additional oblique magnified arteriography confirms similar findings with essentially pinhole arterial extravasation into the pseudoaneurysm. The expected region of contrast extravasation was interrogated with a 5 French catheter as well as a microcatheter. Despite being able to partially visualize contrast extravasation at times, the exact site of extravasation could not be localized or catheterized to allow transcatheter treatment. Lumbar arteries were well delineated and the source of bleeding does not appear to be from a left-sided lumbar artery. Selective left renal arteriography was also performed to exclude a renal artery source for bleeding. A single left renal artery and its branches are well delineated and demonstrate normal patency without evidence bleeding, pseudoaneurysm or other abnormality. Decision was made to also study venous structures and exclude a venous source for bleeding. IVC venography demonstrates normal patency of the inferior vena cava. Mild mass effect on the infrarenal segment of the inferior vena cava is likely on the basis of the large amount of retroperitoneal hemorrhage present. No evidence of thrombus. Renal vein inflow is identified bilaterally. Selective left renal venography was also performed demonstrating  normal patency of the left renal vein and intrarenal venous branches. There is no reflux into the left ovarian vein, evidence of AV malformation or contrast extravasation. IMPRESSION: 1. Arterial source of active bleeding having the appearance of a  left-sided infrarenal pinhole bleed near the expected origin of the left ovarian artery filling a bilobed pseudoaneurysm with faint opacification of a contiguous left ovarian artery descending into the pelvis. Source of bleeding is located approximately 2 cm inferior to the left renal artery orifice. 2. Normal IVC and left renal venography. No evidence of venous source for bleeding. 3. Findings were reviewed directly with Dr. Chestine Spore who was present for much of the procedure and will be planning endovascular repair of the aorta to treat the site of active hemorrhage. Electronically Signed   By: Irish Lack M.D.   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 Bleeding appears to be in close proximity to the expected origin the left ovarian artery by CTA but also the left ovarian vein. EXAM: 1. ULTRASOUND GUIDANCE FOR VASCULAR ACCESS OF THE RIGHT COMMON FEMORAL ARTERY 2. ABDOMINAL AORTOGRAM 3. SELECTIVE ARTERIOGRAPHY OF THE LEFT RENAL ARTERY 4. ULTRASOUND GUIDANCE FOR VASCULAR ACCESS OF THE RIGHT COMMON FEMORAL VEIN 5. IVC VENOGRAPHY 6. SELECTIVE VENOGRAPHY OF THE LEFT RENAL VEIN MEDICATIONS: No additional medications. ANESTHESIA/SEDATION: Moderate (conscious) sedation was employed during this procedure. A total of Versed 6.0 mg and 1 mg IV dilaudid was administered intravenously. Moderate Sedation Time: 125 minutes. The patient's level of consciousness and vital signs were monitored continuously by radiology nursing throughout the procedure under my direct supervision. CONTRAST:  200 mL Visipaque 320 FLUOROSCOPY TIME:  Fluoroscopy Time: 30 minutes and 54 seconds. 668.6 mGy. COMPLICATIONS: None immediate. PROCEDURE: Informed consent was obtained from the patient following explanation of the procedure, risks, benefits  and alternatives. The patient understands, agrees and consents for the procedure. All questions were addressed. A time out was performed prior to the initiation of the procedure. Maximal barrier sterile technique utilized including caps, mask, sterile gowns, sterile gloves, large sterile drape, hand hygiene, and chlorhexidine prep. Ultrasound of the right groin was performed to confirm patency of the right common femoral artery and right common femoral vein. During the procedure local anesthesia was provided with 1% lidocaine. Initial access of the right common femoral artery was performed under direct ultrasound guidance with a 21 gauge needle and micropuncture set. Ultrasound image documentation was performed. After access, a 5 [place] vascular sheath was placed. A 5 French pigtail catheter was then advanced into the abdominal aorta and abdominal aortography performed with the pigtail catheter centered just above the level of the renal arteries. Additional magnified oblique and lateral projection arteriography was also performed over the mid abdominal aorta with injection of the pigtail catheter. The pigtail catheter was exchanged for a 5 [place] Cobra catheter. This was used to selectively catheterize the left renal artery. Selective left renal arteriography was performed. The Cobra catheter was exchanged for a 5 [place] Sos catheter. This was used to probe the mid abdominal aorta with contrast injection performed at multiple levels. A coaxial lantern microcatheter and small caliber guidewire were also advanced through the Sos catheter and used to probe the abdominal aorta. Access of the right common femoral vein was then performed under direct ultrasound guidance with a 21 gauge needle and micropuncture set. Ultrasound image  documentation was performed. A 5 French vascular sheath was placed. A 5 French pigtail catheter was advanced into the lower IVC and IVC venography performed. The pigtail catheter was exchanged  for a 5 [place] Cobra catheter was used to selectively catheterize the left renal vein. Guidewire access was performed further to the level of the left kidney and a 5 [place] KMP catheter further advanced into the left renal vein. Selective left renal venography was then performed. Closure at the arteriotomy site was performed with the Cordis ExoSeal device. The femoral venous sheath was removed and manual compression held. FINDINGS: Abdominal aortogram demonstrates a normal caliber abdominal aorta without evidence of aneurysm, stenosis or dissection. Bilateral single renal arteries appear normally patent. There is focal contrast extravasation along the left side of the infrarenal aorta filling a bilobed shaped pseudoaneurysm as depicted by CTA and faint filling of a contiguous descending vessel felt to most likely represent an abnormal left ovarian artery. Additional oblique magnified arteriography confirms similar findings with essentially pinhole arterial extravasation into the pseudoaneurysm. The expected region of contrast extravasation was interrogated with a 5 French catheter as well as a microcatheter. Despite being able to partially visualize contrast extravasation at times, the exact site of extravasation could not be localized or catheterized to allow transcatheter treatment. Lumbar arteries were well delineated and the source of bleeding does not appear to be from a left-sided lumbar artery. Selective left renal arteriography was also performed to exclude a renal artery source for bleeding. A single left renal artery and its branches are well delineated and demonstrate normal patency without evidence bleeding, pseudoaneurysm or other abnormality. Decision was made to also study venous structures and exclude a venous source for bleeding. IVC venography demonstrates normal patency of the inferior vena cava. Mild mass effect on the infrarenal segment of the inferior vena cava is likely on the basis of the  large amount of retroperitoneal hemorrhage present. No evidence of thrombus. Renal vein inflow is identified bilaterally. Selective left renal venography was also performed demonstrating normal patency of the left renal vein and intrarenal venous branches. There is no reflux into the left ovarian vein, evidence of AV malformation or contrast extravasation. IMPRESSION: 1. Arterial source of active bleeding having the appearance of a left-sided infrarenal pinhole bleed near the expected origin of the left ovarian artery filling a bilobed pseudoaneurysm with faint opacification of a contiguous left ovarian artery descending into the pelvis. Source of bleeding is located approximately 2 cm inferior to the left renal artery orifice. 2. Normal IVC and left renal venography. No evidence of venous source for bleeding. 3. Findings were reviewed directly with Dr. Chestine Spore who was present for much of the procedure and will be planning endovascular repair of the aorta to treat the site of active hemorrhage. Electronically Signed   By: Irish Lack M.D.   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 Bleeding appears to be in close proximity to the expected origin the left ovarian artery by CTA but also the left ovarian vein. EXAM: 1. ULTRASOUND GUIDANCE FOR VASCULAR ACCESS OF THE RIGHT COMMON FEMORAL ARTERY 2. ABDOMINAL AORTOGRAM 3. SELECTIVE ARTERIOGRAPHY OF THE LEFT RENAL ARTERY 4. ULTRASOUND GUIDANCE FOR VASCULAR ACCESS OF THE RIGHT COMMON FEMORAL VEIN 5. IVC VENOGRAPHY 6. SELECTIVE VENOGRAPHY OF THE LEFT RENAL VEIN MEDICATIONS: No additional medications. ANESTHESIA/SEDATION: Moderate (conscious)  sedation was employed during this procedure. A total of Versed 6.0 mg and 1 mg IV dilaudid was administered intravenously. Moderate  Sedation Time: 125 minutes. The patient's level of consciousness and vital signs were monitored continuously by radiology nursing throughout the procedure under my direct supervision. CONTRAST:  200 mL Visipaque 320 FLUOROSCOPY TIME:  Fluoroscopy Time: 30 minutes and 54 seconds. 668.6 mGy. COMPLICATIONS: None immediate. PROCEDURE: Informed consent was obtained from the patient following explanation of the procedure, risks, benefits and alternatives. The patient understands, agrees and consents for the procedure. All questions were addressed. A time out was performed prior to the initiation of the procedure. Maximal barrier sterile technique utilized including caps, mask, sterile gowns, sterile gloves, large sterile drape, hand hygiene, and chlorhexidine prep. Ultrasound of the right groin was performed to confirm patency of the right common femoral artery and right common femoral vein. During the procedure local anesthesia was provided with 1% lidocaine. Initial access of the right common femoral artery was performed under direct ultrasound guidance with a 21 gauge needle and micropuncture set. Ultrasound image documentation was performed. After access, a 5 [place] vascular sheath was placed. A 5 French pigtail catheter was then advanced into the abdominal aorta and abdominal aortography performed with the pigtail catheter centered just above the level of the renal arteries. Additional magnified oblique and lateral projection arteriography was also performed over the mid abdominal aorta with injection of the pigtail catheter. The pigtail catheter was exchanged for a 5 [place] Cobra catheter. This was used to selectively catheterize the left renal artery. Selective left renal arteriography was performed. The Cobra catheter was exchanged for a 5 [place] Sos catheter. This was used to probe the mid abdominal aorta with contrast injection performed at multiple levels. A coaxial lantern microcatheter and small caliber  guidewire were also advanced through the Sos catheter and used to probe the abdominal aorta. Access of the right common femoral vein was then performed under direct ultrasound guidance with a 21 gauge needle and micropuncture set. Ultrasound image documentation was performed. A 5 French vascular sheath was placed. A 5 French pigtail catheter was advanced into the lower IVC and IVC venography performed. The pigtail catheter was exchanged for a 5 [place] Cobra catheter was used to selectively catheterize the left renal vein. Guidewire access was performed further to the level of the left kidney and a 5 [place] KMP catheter further advanced into the left renal vein. Selective left renal venography was then performed. Closure at the arteriotomy site was performed with the Cordis ExoSeal device. The femoral venous sheath was removed and manual compression held. FINDINGS: Abdominal aortogram demonstrates a normal caliber abdominal aorta without evidence of aneurysm, stenosis or dissection. Bilateral single renal arteries appear normally patent. There is focal contrast extravasation along the left side of the infrarenal aorta filling a bilobed shaped pseudoaneurysm as depicted by CTA and faint filling of a contiguous descending vessel felt to most likely represent an abnormal left ovarian artery. Additional oblique magnified arteriography confirms similar findings with essentially pinhole arterial extravasation into the pseudoaneurysm. The expected region of contrast extravasation was interrogated with a 5 French catheter as well as a microcatheter. Despite being able to partially visualize contrast extravasation at times, the exact site of extravasation could not be localized or catheterized to allow transcatheter treatment. Lumbar arteries were well delineated and the source of bleeding does not appear to be from a left-sided lumbar artery. Selective left renal arteriography was also performed to exclude a renal artery  source for bleeding. A single left renal artery and its branches are well delineated and demonstrate normal patency without evidence bleeding, pseudoaneurysm or other abnormality. Decision was made to also study venous structures and exclude a venous source for bleeding. IVC venography demonstrates normal patency of the inferior vena cava. Mild mass effect on the infrarenal segment of the inferior vena cava is likely on the basis of the large amount of retroperitoneal hemorrhage present. No evidence of thrombus. Renal vein inflow is identified bilaterally. Selective left renal venography was also performed demonstrating normal patency of the left renal vein and intrarenal venous branches. There is no reflux into the left ovarian vein, evidence of AV malformation or contrast extravasation. IMPRESSION: 1. Arterial source of active bleeding having the appearance of a left-sided infrarenal pinhole bleed near the expected origin of the left ovarian artery filling a bilobed pseudoaneurysm with faint opacification of a contiguous left ovarian artery descending into the pelvis. Source of bleeding is located approximately 2 cm inferior to the left renal artery orifice. 2. Normal IVC and left renal venography. No evidence of venous source for bleeding. 3. Findings were reviewed directly with Dr. Chestine Spore who was present for much of the procedure and will be planning endovascular repair of the aorta to treat the site of active hemorrhage. Electronically Signed   By: Irish Lack M.D.   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 Bleeding appears to be in close proximity to the expected origin the left ovarian artery by CTA but also the left ovarian vein. EXAM: 1. ULTRASOUND GUIDANCE FOR VASCULAR ACCESS OF THE RIGHT COMMON  FEMORAL ARTERY 2. ABDOMINAL AORTOGRAM 3. SELECTIVE ARTERIOGRAPHY OF THE LEFT RENAL ARTERY 4. ULTRASOUND GUIDANCE FOR VASCULAR ACCESS OF THE RIGHT COMMON FEMORAL VEIN 5. IVC VENOGRAPHY 6. SELECTIVE VENOGRAPHY OF THE LEFT RENAL VEIN MEDICATIONS: No additional medications. ANESTHESIA/SEDATION: Moderate (conscious) sedation was employed during this procedure. A total of Versed 6.0 mg and 1 mg IV dilaudid was administered intravenously. Moderate Sedation Time: 125 minutes. The patient's level of consciousness and vital signs were monitored continuously by radiology nursing throughout the procedure under my direct supervision. CONTRAST:  200 mL Visipaque 320 FLUOROSCOPY TIME:  Fluoroscopy Time: 30 minutes and 54 seconds. 668.6 mGy. COMPLICATIONS: None immediate. PROCEDURE: Informed consent was obtained from the patient following explanation of the procedure, risks, benefits and alternatives. The patient understands, agrees and consents for the procedure. All questions were addressed. A time out was performed prior to the initiation of the procedure. Maximal barrier sterile technique utilized including caps, mask, sterile gowns, sterile gloves, large sterile drape, hand hygiene, and chlorhexidine prep. Ultrasound of the right groin was performed to confirm patency of the right common femoral artery and right common femoral vein. During the procedure local anesthesia was provided with 1% lidocaine. Initial access of the right common femoral artery was performed under direct ultrasound guidance with a 21 gauge needle and micropuncture set. Ultrasound image documentation was performed. After access, a 5 [place] vascular sheath was placed. A 5 French pigtail catheter was then advanced into the abdominal aorta and abdominal aortography performed with the pigtail catheter centered just above the level of the renal arteries. Additional magnified oblique and lateral projection arteriography was also performed over the mid abdominal  aorta with injection of the pigtail catheter. The pigtail catheter was exchanged for  a 5 [place] Cobra catheter. This was used to selectively catheterize the left renal artery. Selective left renal arteriography was performed. The Cobra catheter was exchanged for a 5 [place] Sos catheter. This was used to probe the mid abdominal aorta with contrast injection performed at multiple levels. A coaxial lantern microcatheter and small caliber guidewire were also advanced through the Sos catheter and used to probe the abdominal aorta. Access of the right common femoral vein was then performed under direct ultrasound guidance with a 21 gauge needle and micropuncture set. Ultrasound image documentation was performed. A 5 French vascular sheath was placed. A 5 French pigtail catheter was advanced into the lower IVC and IVC venography performed. The pigtail catheter was exchanged for a 5 [place] Cobra catheter was used to selectively catheterize the left renal vein. Guidewire access was performed further to the level of the left kidney and a 5 [place] KMP catheter further advanced into the left renal vein. Selective left renal venography was then performed. Closure at the arteriotomy site was performed with the Cordis ExoSeal device. The femoral venous sheath was removed and manual compression held. FINDINGS: Abdominal aortogram demonstrates a normal caliber abdominal aorta without evidence of aneurysm, stenosis or dissection. Bilateral single renal arteries appear normally patent. There is focal contrast extravasation along the left side of the infrarenal aorta filling a bilobed shaped pseudoaneurysm as depicted by CTA and faint filling of a contiguous descending vessel felt to most likely represent an abnormal left ovarian artery. Additional oblique magnified arteriography confirms similar findings with essentially pinhole arterial extravasation into the pseudoaneurysm. The expected region of contrast extravasation was  interrogated with a 5 French catheter as well as a microcatheter. Despite being able to partially visualize contrast extravasation at times, the exact site of extravasation could not be localized or catheterized to allow transcatheter treatment. Lumbar arteries were well delineated and the source of bleeding does not appear to be from a left-sided lumbar artery. Selective left renal arteriography was also performed to exclude a renal artery source for bleeding. A single left renal artery and its branches are well delineated and demonstrate normal patency without evidence bleeding, pseudoaneurysm or other abnormality. Decision was made to also study venous structures and exclude a venous source for bleeding. IVC venography demonstrates normal patency of the inferior vena cava. Mild mass effect on the infrarenal segment of the inferior vena cava is likely on the basis of the large amount of retroperitoneal hemorrhage present. No evidence of thrombus. Renal vein inflow is identified bilaterally. Selective left renal venography was also performed demonstrating normal patency of the left renal vein and intrarenal venous branches. There is no reflux into the left ovarian vein, evidence of AV malformation or contrast extravasation. IMPRESSION: 1. Arterial source of active bleeding having the appearance of a left-sided infrarenal pinhole bleed near the expected origin of the left ovarian artery filling a bilobed pseudoaneurysm with faint opacification of a contiguous left ovarian artery descending into the pelvis. Source of bleeding is located approximately 2 cm inferior to the left renal artery orifice. 2. Normal IVC and left renal venography. No evidence of venous source for bleeding. 3. Findings were reviewed directly with Dr. Chestine Spore who was present for much of the procedure and will be planning endovascular repair of the aorta to treat the site of active hemorrhage. Electronically Signed   By: Irish Lack M.D.   On:  10/16/2019 09:46   DG Chest Port 1 View  Result Date: 10/15/2019 CLINICAL DATA:  Retroperitoneal  hematoma, endovascular aortic stent graft EXAM: PORTABLE CHEST 1 VIEW COMPARISON:  None. FINDINGS: Single frontal view of the chest demonstrates right internal jugular catheter tip overlying superior vena cava. The cardiac silhouette is unremarkable. There is patchy consolidation within the medial aspect of the lung bases bilaterally, likely atelectasis. No effusion or pneumothorax. No acute bony abnormalities. IMPRESSION: 1. Bilateral lower lobe atelectasis.  Otherwise unremarkable exam. Electronically Signed   By: Sharlet Salina M.D.   On: 10/15/2019 22:08   HYBRID OR IMAGING ([facility] ONLY)  Result Date: 10/15/2019 There is no interpretation for this exam.  This order is for images obtained during a surgical procedure.  Please See "Surgeries" Tab for more information regarding the procedure.   CT Angio Abd/Pel w/ and/or w/o  Result Date: 10/15/2019 CLINICAL DATA:  54 year old with a large retroperitoneal hemorrhage and focal area of contrast extravasation on previous CT of the abdomen and pelvis. CTA performed in order to try to localize the source of the bleeding. EXAM: CT ANGIOGRAPHY ABDOMEN AND PELVIS WITH CONTRAST AND WITHOUT CONTRAST TECHNIQUE: Multidetector CT imaging of the abdomen and pelvis was performed using the standard protocol during bolus administration of intravenous contrast. Multiplanar reconstructed images and MIPs were obtained and reviewed to evaluate the vascular anatomy. CONTRAST:  OMNIPAQUE IOHEXOL 300 MG/ML  SOLN COMPARISON:  CT abdomen and pelvis 10/14/2019 FINDINGS: VASCULAR Aorta: Normal caliber of the abdominal aorta without dissection or any significant atherosclerotic disease. There is an enhancing bilobed structure just left of the abdominal aorta situated between the left renal artery and the IMA. This structure enhances during the arterial phase of contrast although the  contrast enhancement is less dense compared to the aorta and mesenteric vessels. This structure is brighter on the venous phase imaging. This bilobed vascular structure measures up to 3.6 cm in greatest diameter and similar to the examination from 10/14/2019. Findings are suggestive for large irregular pseudoaneurysm. There is an irregular vessel connected to this bilobed structure and this vessel extends down to the left ovary. This enhancing structure is very irregular and measures up to 5 mm. Prior study raised concern that this vessel could represent the left ovarian vein but likely represents left ovarian artery with underlying vascular disease such as FMD. Celiac: The celiac trunk is widely patent. There is mild irregularity and beading of the right common hepatic artery. Findings raise concern for fibromuscular dysplasia. Hepatic arteries and GDA are patent. There may be a slightly beaded appearance of the proximal splenic artery. Small amount of flow in the left gastric artery. SMA: SMA is patent. Mild irregularity of the mid SMA is also concerning for fibromuscular dysplasia. Renals: Bilateral renal arteries are patent without dissection. No evidence for atherosclerotic disease or stenosis. No evidence for stenoses or aneurysms involving the renal arteries. IMA: IMA is very small but patent. Inflow: Common, internal and external iliac arteries are widely patent. Beaded appearance of the right external iliac artery is suggestive for fibromuscular dysplasia. Beaded appearance of the left external iliac artery with focal ectasia in the distal left external iliac artery measuring up to 7 mm. Proximal Outflow: Proximal femoral arteries are patent bilaterally. Veins: Normal appearance of the portal venous system. SMV and IMV are patent. IVC and bilateral renal veins are patent. Focal outpouching in left renal vein at the expected origin of the left ovarian vein. Iliac venous structures are patent. Review of the  MIP images confirms the above findings. NON-VASCULAR Lower chest: New small left pleural effusion. Again noted is  a pleural-based nodule along the left major fissure on sequence 10, image 2 that measures roughly 5 mm. Again noted is a fluid or blood in the right costophrenic region. Hepatobiliary: There is now a small amount of perihepatic ascites near the hepatic dome. Contrast within the gallbladder compatible with vicarious excretion of contrast. Again noted are low-density structures in liver likely representing hepatic cysts. Pancreas: Normal appearance of the pancreas. Spleen: Normal in size without focal abnormality. Adrenals/Urinary Tract: No gross abnormality to the adrenal glands. Again noted is abnormality along the superolateral aspect of the right kidney. There is retained contrast in this area on the precontrast images and abnormal perfusion on the post contrast images. Again noted is marked displacement of the left kidney related to the large retroperitoneal hemorrhage. Some abnormal perfusion along the left kidney upper pole is similar to the previous examination. Again noted is fullness of the left renal collecting system compatible with the compression from the retroperitoneal hematoma. Stomach/Bowel: Duodenum is displaced anteriorly from a large retroperitoneal hemorrhage. No evidence for bowel obstruction. Lymphatic: No significant lymphadenopathy in the abdomen or pelvis. Reproductive: Uterus and bilateral adnexa are unremarkable. Other: Again noted is a massive amount of blood within the abdomen with a large left retroperitoneal hematoma. Hematoma measures up to 11.1 cm in the AP dimension on sequence 8, image 90 and roughly measured 10.7 cm on the study from 10/14/2019. Increased blood around the liver. Overall, there appears to be slightly increased retroperitoneal blood. Again noted is a large amount of blood between the aorta and the transverse duodenum. Again noted is blood in the right  abdomen and there is increased blood around the uterus. There is now fluid in the pelvis which is new. Musculoskeletal: No acute bone abnormality. IMPRESSION: VASCULAR 1. 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 This pseudoaneurysm has bilobed and irregular configuration and measures up to 3.6 cm. The size has not significantly changed from the comparison on 10/14/2019. There is a vessel in continuity with the pseudoaneurysm which could represent an abnormal left ovarian artery. The origin of the left ovarian artery is not clearly identified and the exact source for this pseudoaneurysm is not identified. 2. Irregularity, ectasia and beaded appearance of multiple vessels including bilateral external iliac arteries, common hepatic artery, splenic artery and SMA. These findings raise concern for an underlying vascular abnormality such as fibromuscular dysplasia. The vessel attached to the pseudoaneurysm also has irregularity and suggests that the etiology for the retroperitoneal hemorrhage was rupture of the small artery which may represent the left ovarian artery. 3. Pseudoaneurysm is in close proximity to the expected region of the left ovarian vein and an arteriovenous malformation cannot be excluded. NON-VASCULAR 1. Large retroperitoneal hematoma with increased fluid and blood within the abdominal cavity and pelvis. 2. Again noted are irregular areas of enhancement involving the both kidneys upper poles, particularly along the right side. Findings raise concern for an inflammatory process or vascular compromise in these areas. Fullness of left renal collecting system related to the large hematoma. 3. New small left pleural effusion. Electronically Signed   By: Richarda Overlie M.D.   On: 10/15/2019 15:44      Subjective: Overall continues to improve and feels better.  Had a BM.  Abdomen sore only on mild intermittent dry coughing or moving around.  Tolerating diet without nausea or  vomiting.  Ambulating without dizziness, lightheadedness, dyspnea, chest pain or palpitations.  As per RN, no acute issues reported.  Discharge Exam:  Vitals:   10/17/19 2058 10/18/19 0044 10/18/19 0500 10/18/19 0805  BP: (!) 152/91 (!) 170/86 (!) 149/77 (!) 145/86  Pulse: 84 77 76 80  Resp: 19 (!) 26 (!) 23 20  Temp: 99.2 F (37.3 C) 98.9 F (37.2 C) 99.8 F (37.7 C) 99.5 F (37.5 C)  TempSrc: Oral Oral Oral Oral  SpO2: 100% 100% 100% 100%  Weight:      Height:        General exam: Young female, moderately built and nourished sitting up comfortably in bed without distress. Respiratory system: Clear to auscultation.  No increased work of breathing. Cardiovascular system: S1 and S2 heard, RRR.  No JVD, murmurs or pedal edema.  Telemetry personally reviewed: Sinus rhythm. Gastrointestinal system: Abdomen remains nondistended, soft and nontender.  No organomegaly or masses appreciated.  Normal bowel sounds heard. Central nervous system: Alert and oriented. No focal neurological deficits. Extremities: Symmetric 5 x 5 power.  Peripheral pulses symmetrically felt in bilateral lower extremities. Skin: No rashes, lesions or ulcers Psychiatry: Judgement and insight appear normal. Mood & affect appropriate.     The results of significant diagnostics from this hospitalization (including imaging, microbiology, ancillary and laboratory) are listed below for reference.     Microbiology: Recent Results (from the past 240 hour(s))  Respiratory Panel by RT PCR (Flu A&B, Covid) - Nasopharyngeal Swab     Status: None   Collection Time: 10/14/19  4:52 PM   Specimen: Nasopharyngeal Swab  Result Value Ref Range Status   SARS Coronavirus 2 by RT PCR NEGATIVE NEGATIVE Final    Comment: (NOTE) SARS-CoV-2 target nucleic acids are NOT DETECTED. The SARS-CoV-2 RNA is generally detectable in upper respiratoy specimens during the acute phase of infection. The lowest concentration of SARS-CoV-2 viral  copies this assay can detect is 131 copies/mL. A negative result does not preclude SARS-Cov-2 infection and should not be used as the sole basis for treatment or other patient management decisions. A negative result may occur with  improper specimen collection/handling, submission of specimen other than nasopharyngeal swab, presence of viral mutation(s) within the areas targeted by this assay, and inadequate number of viral copies (<131 copies/mL). A negative result must be combined with clinical observations, patient history, and epidemiological information. The expected result is Negative. Fact Sheet for Patients:  https://www.moore.com/https://www.fda.gov/media/142436/download Fact Sheet for Healthcare Providers:  https://www.young.biz/https://www.fda.gov/media/142435/download This test is not yet ap proved or cleared by the [place] FDA and  has been authorized for detection and/or diagnosis of SARS-CoV-2 by FDA under an Emergency Use Authorization (EUA). This EUA will remain  in effect (meaning this test can be used) for the duration of the COVID-19 declaration under Section 564(b)(1) of the Act, 21 U.S.C. section 360bbb-3(b)(1), unless the authorization is terminated or revoked sooner.    Influenza A by PCR NEGATIVE NEGATIVE Final   Influenza B by PCR NEGATIVE NEGATIVE Final    Comment: (NOTE) The Xpert Xpress SARS-CoV-2/FLU/RSV assay is intended as an aid in  the diagnosis of influenza from Nasopharyngeal swab specimens and  should not be used as a sole basis for treatment. Nasal washings and  aspirates are unacceptable for Xpert Xpress SARS-CoV-2/FLU/RSV  testing. Fact Sheet for Patients: https://www.moore.com/https://www.fda.gov/media/142436/download Fact Sheet for Healthcare Providers: https://www.young.biz/https://www.fda.gov/media/142435/download This test is not yet approved or cleared by the [place] FDA and  has been authorized for detection and/or diagnosis of SARS-CoV-2 by  FDA under an Emergency Use Authorization (EUA). This EUA will  remain  in effect (meaning this test can  be used) for the duration of the  Covid-19 declaration under Section 564(b)(1) of the Act, 21  U.S.C. section 360bbb-3(b)(1), unless the authorization is  terminated or revoked. Performed at [facility], [address] [place], [place] [id number]      Labs: CBC: Recent Labs  Lab 10/14/19 1341 10/15/19 0148 10/15/19 0254 10/15/19 0254 10/15/19 0854 10/15/19 1947 10/15/19 2215 10/16/19 0730 10/17/19 0337 10/17/19 1645 10/18/19 0254  WBC 12.5*  --  21.7*   < > 24.5*  --  23.4* 20.7* 18.8* 13.7* 11.7*  NEUTROABS 11.3*  --  19.2*  --  22.0*  --  19.3* 19.3*  --   --   --   HGB 8.2*   < > 8.6*   < > 7.7*   < > 10.0* 9.3* 8.1* 8.6* 8.8*  HCT 24.6*   < > 25.7*   < > 23.6*   < > 28.9* 27.6* 23.5* 25.5* 26.8*  MCV 92.1  --  90.5   < > 91.5  --  87.6 89.9 89.0 91.1 90.2  PLT 312  --  318   < > 322  --  215 226 210 233 257   < > = values in this interval not displayed.    Basic Metabolic Panel: Recent Labs  Lab 10/14/19 1341 10/15/19 0148 10/15/19 0254 10/15/19 0254 10/15/19 1947 10/15/19 2215 10/16/19 0730 10/17/19 0337 10/18/19 0254  NA   < >  --  137   < > 135 138 137 138 137  K   < >  --  4.8   < > 5.1 5.0 4.4 4.0 3.9  CL   < >  --  101   < > 101 104 103 106 101  CO2   < >  --  28  --   --  24 25 24 25   GLUCOSE   < >  --  136*   < > 118* 122* 135* 92 98  BUN   < >  --  13   < > 26* 25* 22* 13 10  CREATININE   < >  --  1.04*   < > 1.00 1.13* 0.99 0.75 0.77  CALCIUM   < >  --  8.9  --   --  8.5* 8.6* 8.3* 8.4*  MG  --  2.2  --   --   --  2.4  --   --   --   PHOS  --  5.9*  --   --   --   --   --   --   --    < > = values in this interval not displayed.    Liver Function Tests: Recent Labs  Lab 10/14/19 1341 10/16/19 0730  AST 18 17  ALT 19 16  ALKPHOS 73 64  BILITOT 1.0 1.0  PROT 6.2* 5.6*  ALBUMIN 3.2* 2.5*    Urinalysis    Component Value Date/Time   COLORURINE YELLOW 10/14/2019 1221   APPEARANCEUR CLEAR  10/14/2019 1221   LABSPEC 1.015 10/14/2019 1221   PHURINE 9.0 (H) 10/14/2019 1221   GLUCOSEU NEGATIVE 10/14/2019 1221   HGBUR NEGATIVE 10/14/2019 1221   BILIRUBINUR NEGATIVE 10/14/2019 1221   KETONESUR 80 (A) 10/14/2019 1221   PROTEINUR 30 (A) 10/14/2019 1221   NITRITE NEGATIVE 10/14/2019 1221   LEUKOCYTESUR NEGATIVE 10/14/2019 1221      Time coordinating discharge: 35 minutes  SIGNED:  Marcellus ScottAnand Lizanne Erker, MD, FACP, [facility]. [facility]  To contact the  attending provider between 7A-7P or the covering provider during after hours 7P-7A, please log into the web site [email or web address] and access using universal [place] password for that web site. If you do not have the password, please call the hospital operator.

## 2019-10-18 NOTE — Progress Notes (Addendum)
  Progress Note    10/18/2019 7:21 AM 3 Days Post-Op  Subjective:  Resting comfortably in bed. States she her stomach is sore but otherwise doing well. Says she has had BM last evening. Has still been able to get up and ambulate without difficulty. Has been able to eat without nausea, vomiting or diarrhea  Vitals:   10/18/19 0044 10/18/19 0500  BP: (!) 170/86 (!) 149/77  Pulse: 77 76  Resp: (!) 26 (!) 23  Temp: 98.9 F (37.2 C) 99.8 F (37.7 C)  SpO2: 100% 100%   Physical Exam: General: appears well nourished, not in any distress Lungs:  Non labored, equal expansion Incisions:  Left groin access site soft, minimally tender, no swelling, no bleeding or hematoma Extremities:  2+ palpable femoral pulses, popliteal pulses, 2+ DP and PT. Normal motor and sensation  Abdomen:  Soft, mildly tender across lower abdomen Neurologic: alert and oriented  CBC    Component Value Date/Time   WBC 11.7 (H) 10/18/2019 0254   RBC 2.97 (L) 10/18/2019 0254   HGB 8.8 (L) 10/18/2019 0254   HCT 26.8 (L) 10/18/2019 0254   PLT 257 10/18/2019 0254   MCV 90.2 10/18/2019 0254   MCH 29.6 10/18/2019 0254   MCHC 32.8 10/18/2019 0254   RDW 14.6 10/18/2019 0254   LYMPHSABS 0.7 10/16/2019 0730   MONOABS 0.5 10/16/2019 0730   EOSABS 0.0 10/16/2019 0730   BASOSABS 0.0 10/16/2019 0730    BMET    Component Value Date/Time   NA 137 10/18/2019 0254   K 3.9 10/18/2019 0254   CL 101 10/18/2019 0254   CO2 25 10/18/2019 0254   GLUCOSE 98 10/18/2019 0254   BUN 10 10/18/2019 0254   CREATININE 0.77 10/18/2019 0254   CALCIUM 8.4 (L) 10/18/2019 0254   GFRNONAA >60 10/18/2019 0254   GFRAA >60 10/18/2019 0254    INR    Component Value Date/Time   INR 1.2 10/15/2019 2215     Intake/Output Summary (Last 24 hours) at 10/18/2019 0721 Last data filed at 10/17/2019 2100 Gross per 24 hour  Intake 960 ml  Output 801 ml  Net 159 ml     Assessment/Plan:  54 y.o. female is s/p percutaneous access and  closer of left common femoral artery for delivery of Endograft, Aortogram, Endovascular repair of the infrarenal abdominal aorta by deployment of aortic stent graft x2 3 Days Post-Op.  Left groin access looks good. Lower extremity pulses easily palpable. Hgb stable 8.8. Leukocytosis trending down. Aspirin restart today. Continue mobilization. Will have follow up CTA and follow up in 4 weeks with Dr. Loistine Chance, PA-C [facility] [phone] 10/18/2019 7:21 AM   I have seen and evaluated the patient. I agree with the PA note as documented above.  Postop day 3 status post infrarenal aortic stent x2 for aortic blush in the setting of large left retroperitoneal hematoma.  Left groin looks good and has a palpable pulse in the left foot.  Hemoglobin is now increasing with no transfusion requirements over the weekend.  She can be discharged from my standpoint.  Discussed again I am unclear on the etiology for this.  I will have her follow-up with me in 1 month with CTA abdomen pelvis.  Cephus Shelling, MD [facility] [place] Office: [phone]

## 2019-10-18 NOTE — Discharge Instructions (Signed)
[facility]   Discharge Instructions  Endovascular Aortic Aneurysm Repair  Please refer to the following instructions for your post-procedure care. Your surgeon or Physician Assistant will discuss any changes with you.  Activity  You are encouraged to walk as much as you can. You can slowly return to normal activities but must avoid strenuous activity and heavy lifting until your doctor tells you it's OK. Avoid activities such as vacuuming or swinging a gold club. It is normal to feel tired for several weeks after your surgery. Do not drive until your doctor gives the OK and you are no longer taking prescription pain medications. It is also normal to have difficulty with sleep habits, eating, and bowel movements after surgery. These will go away with time.  Bathing/Showering  Shower daily after you go home.  Do not soak in a bathtub, hot tub, or swim until the incision heals completely.  If you have incisions in your groin, wash the groin wounds with soap and water daily and pat dry. (No tub bath-only shower)  Then put a dry gauze or washcloth there to keep this area dry to help prevent wound infection daily and as needed.  Do not use Vaseline or neosporin on your incisions.  Only use soap and water on your incisions and then protect and keep dry.  Incision Care  Shower every day. Clean your incision with mild soap and water. Pat the area dry with a clean towel. You do not need a bandage unless otherwise instructed. Do not apply any ointments or creams to your incision. If you clothing is irritating, you may cover your incision with a dry gauze pad.  Diet  Resume your normal diet. There are no special food restrictions following this procedure. A low fat/low cholesterol diet is recommended for all patients with vascular disease. In order to heal from your surgery, it is CRITICAL to get adequate nutrition. Your body requires vitamins, minerals, and protein.  Vegetables are the best source of vitamins and minerals. Vegetables also provide the perfect balance of protein. Processed food has little nutritional value, so try to avoid this.  Medications  Resume taking all of your medications unless your doctor or nurse practitioner tells you not to. If your incision is causing pain, you may take over-the-counter pain relievers such as acetaminophen (Tylenol). If you were prescribed a stronger pain medication, please be aware these medications can cause nausea and constipation. Prevent nausea by taking the medication with a snack or meal. Avoid constipation by drinking plenty of fluids and eating foods with a high amount of fiber, such as fruits, vegetables, and grains.  Do not take Tylenol if you are taking prescription pain medications.   Follow up  Our office will schedule a follow-up appointment with a CT scan 3-4 weeks after your surgery.  Please call us immediately for any of the following conditions  . Severe or worsening pain in your legs or feet or in your abdomen back or chest. . Increased pain, redness, drainage (pus) from your incision site. . Increased abdominal pain, bloating, nausea, vomiting or persistent diarrhea. . Fever of 101 degrees or higher. . Swelling in your leg (s), .  Reduce your risk of vascular disease  .Stop smoking. If you would like help call [place] at 1-800-QUIT-NOW (501-193-2849) or [place] at [phone]. .Manage your cholesterol .Maintain a desired weight .Control your diabetes .Keep your blood pressure down  If you have questions, please call the office at [phone].  Additional discharge instructions  Please get your medications reviewed and adjusted by your Primary MD.  Please request your Primary MD to go over all Hospital Tests and Procedure/Radiological results at the follow up, please get all Hospital records sent to your Prim MD by signing hospital release before you go home.  If  you had Pneumonia of Lung problems at the Hospital: Please get a 2 view Chest X ray done in 6-8 weeks after hospital discharge or sooner if instructed by your Primary MD.  If you have Congestive Heart Failure: Please call your Cardiologist or Primary MD anytime you have any of the following symptoms:  1) 3 pound weight gain in 24 hours or 5 pounds in 1 week  2) shortness of breath, with or without a dry hacking cough  3) swelling in the hands, feet or stomach  4) if you have to sleep on extra pillows at night in order to breathe  Follow cardiac low salt diet and 1.5 lit/day fluid restriction.  If you have diabetes Accuchecks 4 times/day, Once in AM empty stomach and then before each meal. Log in all results and show them to your primary doctor at your next visit. If any glucose reading is under 80 or above 300 call your primary MD immediately.  If you have Seizure/Convulsions/Epilepsy: Please do not drive, operate heavy machinery, participate in activities at heights or participate in high speed sports until you have seen by Primary MD or a Neurologist and advised to do so again.  If you had Gastrointestinal Bleeding: Please ask your Primary MD to check a complete blood count within one week of discharge or at your next visit. Your endoscopic/colonoscopic biopsies that are pending at the time of discharge, will also need to followed by your Primary MD.  Get Medicines reviewed and adjusted. Please take all your medications with you for your next visit with your Primary MD  Please request your Primary MD to go over all hospital tests and procedure/radiological results at the follow up, please ask your Primary MD to get all Hospital records sent to his/her office.  If you experience worsening of your admission symptoms, develop shortness of breath, life threatening emergency, suicidal or homicidal thoughts you must seek medical attention immediately by calling 911 or calling your MD  immediately  if symptoms less severe.  You must read complete instructions/literature along with all the possible adverse reactions/side effects for all the Medicines you take and that have been prescribed to you. Take any new Medicines after you have completely understood and accpet all the possible adverse reactions/side effects.   Do not drive or operate heavy machinery when taking Pain medications.   Do not take more than prescribed Pain, Sleep and Anxiety Medications  Special Instructions: If you have smoked or chewed Tobacco  in the last 2 yrs please stop smoking, stop any regular Alcohol  and or any Recreational drug use.  Wear Seat belts while driving.  Please note You were cared for by a hospitalist during your hospital stay. If you have any questions about your discharge medications or the care you received while you were in the hospital after you are discharged, you can call the unit and asked to speak with the hospitalist on call if the hospitalist that took care of you is not available. Once you are discharged, your primary care physician will handle any further medical issues. Please note that NO REFILLS for any discharge medications will be authorized once you are discharged, as  it is imperative that you return to your primary care physician (or establish a relationship with a primary care physician if you do not have one) for your aftercare needs so that they can reassess your need for medications and monitor your lab values.  You can reach the hospitalist office at phone [phone] or fax [phone]   If you do not have a primary care physician, you can call [phone] for a physician referral.

## 2019-10-18 NOTE — Plan of Care (Signed)
Patient given discharge instructions and prescription information.  Instructed patient on incisional care.  Patient had no further questions at this time.  Awaiting ride home.

## 2019-10-25 ENCOUNTER — Other Ambulatory Visit: Payer: Self-pay

## 2019-10-25 DIAGNOSIS — K661 Hemoperitoneum: Secondary | ICD-10-CM | POA: Diagnosis not present

## 2019-10-25 DIAGNOSIS — R030 Elevated blood-pressure reading, without diagnosis of hypertension: Secondary | ICD-10-CM | POA: Diagnosis not present

## 2019-10-25 DIAGNOSIS — Z6823 Body mass index (BMI) 23.0-23.9, adult: Secondary | ICD-10-CM | POA: Diagnosis not present

## 2019-10-25 DIAGNOSIS — F332 Major depressive disorder, recurrent severe without psychotic features: Secondary | ICD-10-CM | POA: Diagnosis not present

## 2019-10-25 DIAGNOSIS — D62 Acute posthemorrhagic anemia: Secondary | ICD-10-CM | POA: Diagnosis not present

## 2019-10-25 DIAGNOSIS — I998 Other disorder of circulatory system: Secondary | ICD-10-CM

## 2019-10-26 ENCOUNTER — Other Ambulatory Visit: Payer: Self-pay

## 2019-10-26 ENCOUNTER — Encounter (HOSPITAL_COMMUNITY): Payer: Self-pay | Admitting: Emergency Medicine

## 2019-10-26 ENCOUNTER — Emergency Department (HOSPITAL_COMMUNITY)
Admission: EM | Admit: 2019-10-26 | Discharge: 2019-10-26 | Disposition: A | Discharge status: Home / Self Care | Payer: BC Managed Care – PPO | Attending: Emergency Medicine | Admitting: Emergency Medicine

## 2019-10-26 ENCOUNTER — Emergency Department (HOSPITAL_COMMUNITY): Payer: BC Managed Care – PPO

## 2019-10-26 DIAGNOSIS — W1789XA Other fall from one level to another, initial encounter: Secondary | ICD-10-CM | POA: Insufficient documentation

## 2019-10-26 DIAGNOSIS — H538 Other visual disturbances: Secondary | ICD-10-CM | POA: Insufficient documentation

## 2019-10-26 DIAGNOSIS — Y9389 Activity, other specified: Secondary | ICD-10-CM | POA: Insufficient documentation

## 2019-10-26 DIAGNOSIS — G44309 Post-traumatic headache, unspecified, not intractable: Secondary | ICD-10-CM | POA: Diagnosis not present

## 2019-10-26 DIAGNOSIS — Y999 Unspecified external cause status: Secondary | ICD-10-CM | POA: Diagnosis not present

## 2019-10-26 DIAGNOSIS — R519 Headache, unspecified: Secondary | ICD-10-CM | POA: Diagnosis not present

## 2019-10-26 DIAGNOSIS — Y929 Unspecified place or not applicable: Secondary | ICD-10-CM | POA: Insufficient documentation

## 2019-10-26 DIAGNOSIS — G930 Cerebral cysts: Secondary | ICD-10-CM

## 2019-10-26 LAB — CBC WITH DIFFERENTIAL/PLATELET
Abs Immature Granulocytes: 0.06 10*3/uL (ref 0.00–0.07)
Basophils Absolute: 0 10*3/uL (ref 0.0–0.1)
Basophils Relative: 0 %
Eosinophils Absolute: 0.1 10*3/uL (ref 0.0–0.5)
Eosinophils Relative: 1 %
HCT: 36.9 % (ref 36.0–46.0)
Hemoglobin: 11.3 g/dL — ABNORMAL LOW (ref 12.0–15.0)
Immature Granulocytes: 1 %
Lymphocytes Relative: 24 %
Lymphs Abs: 1.8 10*3/uL (ref 0.7–4.0)
MCH: 29.3 pg (ref 26.0–34.0)
MCHC: 30.6 g/dL (ref 30.0–36.0)
MCV: 95.6 fL (ref 80.0–100.0)
Monocytes Absolute: 0.5 10*3/uL (ref 0.1–1.0)
Monocytes Relative: 7 %
Neutro Abs: 5 10*3/uL (ref 1.7–7.7)
Neutrophils Relative %: 67 %
Platelets: 518 10*3/uL — ABNORMAL HIGH (ref 150–400)
RBC: 3.86 MIL/uL — ABNORMAL LOW (ref 3.87–5.11)
RDW: 14.6 % (ref 11.5–15.5)
WBC: 7.5 10*3/uL (ref 4.0–10.5)
nRBC: 0 % (ref 0.0–0.2)

## 2019-10-26 LAB — BASIC METABOLIC PANEL
Anion gap: 9 (ref 5–15)
BUN: 15 mg/dL (ref 6–20)
CO2: 26 mmol/L (ref 22–32)
Calcium: 9.1 mg/dL (ref 8.9–10.3)
Chloride: 101 mmol/L (ref 98–111)
Creatinine, Ser: 0.74 mg/dL (ref 0.44–1.00)
GFR calc Af Amer: >60 mL/min (ref >60)
GFR calc non Af Amer: >60 mL/min (ref >60)
Glucose, Bld: 87 mg/dL (ref 70–99)
Potassium: 3.6 mmol/L (ref 3.5–5.1)
Sodium: 136 mmol/L (ref 135–145)

## 2019-10-26 NOTE — ED Triage Notes (Signed)
Patient complains of headaches which she believes is due to a fall out of a tree stand Patient is unable to confirm date of the accident. Patient states she may have fell from 40 feet. Patient is able to recall if she loss consciousness.

## 2019-10-26 NOTE — Discharge Instructions (Addendum)
You were seen in the emergency department for evaluation of headache in the setting of a possible fall.  Your CAT scan did not show any signs of any traumatic brain injury.  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  I reviewed this with Dr. Maisie Fus from [facility] neurosurgery and he and he did not feel this would be the cause of your symptoms but he did recommend that you can follow-up with them in the office for any concerns.

## 2019-10-26 NOTE — ED Provider Notes (Signed)
[facility] EMERGENCY DEPARTMENT Provider Note   CSN: [id number] Arrival date & time: 10/26/19  1254     History Chief Complaint  Patient presents with  . Headache    Shelley Hines is a 54 y.o. female.  She was seen here earlier this month for abdominal pain and found to have a retroperitoneal bleed from a leaking infrarenal aneurysm.  She was ultimately sent to [facility] where she received an endograft with good success.  She is complaining of headaches that have been on and off since she fell out of a tree stand.  She relates that she was taking pills and drinking and fell and does not know if she lost consciousness.  She does not know when this was but she thinks it was prior to her retroperitoneal hematoma.  She has had a history of brain bleeds and she wants to make sure that she did not cause any bleeding in her brain.  She says sometimes she will have some kaleidoscopic vision.  No numbness or weakness.  No fevers or chills.  The history is provided by the patient.  Headache Pain location:  Generalized Quality:  Dull Radiates to:  Does not radiate Pain severity now: mild. Pain scale at highest: moderate. Onset quality:  Gradual Timing:  Intermittent Progression:  Unchanged Chronicity:  New Context comment:  ?trauma Relieved by:  None tried Worsened by:  Nothing Ineffective treatments:  None tried Associated symptoms: visual change   Associated symptoms: no abdominal pain, no cough, no eye pain, no fever, no focal weakness, no loss of balance, no neck pain, no photophobia, no sore throat and no tingling        Past Medical History:  Diagnosis Date  . Depression   . Primary hyperthyroidism     Patient Active Problem List   Diagnosis Date Noted  . Retroperitoneal hemorrhage 10/14/2019  . Alcohol dependence with alcohol-induced mood disorder (HCC)   . MDD (major depressive disorder), recurrent severe, without psychosis (HCC) 08/09/2019  . Thyrotoxicosis without mention  of goiter or other cause, without mention of thyrotoxic crisis or storm 04/24/2014    Past Surgical History:  Procedure Laterality Date  . ABDOMINAL AORTIC ENDOVASCULAR STENT GRAFT N/A 10/15/2019   Procedure: ABDOMINAL AORTIC ENDOVASCULAR STENT GRAFT;  Surgeon: Cephus Shelling, MD;  Location: [facility] OR;  Service: Vascular;  Laterality: N/A;  . BRAIN SURGERY    . CESAREAN SECTION    . IR RENAL SELECTIVE  UNI INC S&I MOD SED  10/15/2019  . IR US GUIDE VASC ACCESS RIGHT  10/15/2019  . IR US GUIDE VASC ACCESS RIGHT  10/15/2019  . IR VENOCAVAGRAM IVC  10/15/2019  . IR VENOGRAM RENAL UNI LEFT  10/15/2019     OB History    Gravida  7   Para      Term      Preterm      AB      Living  6     SAB      TAB      Ectopic      Multiple      Live Births              Family History  Problem Relation Age of Onset  . Thyroid disease Neg Hx     Social History   Tobacco Use  . Smoking status: Never Smoker  . Smokeless tobacco: Never Used  Substance Use Topics  . Alcohol use: Not Currently    Comment: occasionally  .  Drug use: Not Currently    Types: Marijuana    Home Medications Prior to Admission medications   Medication Sig Start Date End Date Taking? Authorizing Provider  acetaminophen (TYLENOL) 325 MG tablet Take 2 tablets (650 mg total) by mouth every 6 (six) hours as needed for mild pain, moderate pain or fever (or temp >/= 101 F). 10/18/19   Hongalgi, Lenis Dickinson, MD  aspirin EC 81 MG EC tablet Take 1 tablet (81 mg total) by mouth daily at 6 (six) AM. 10/19/19   Hongalgi, Lenis Dickinson, MD  buPROPion (WELLBUTRIN XL) 150 MG 24 hr tablet Take 3 tablets (450 mg total) by mouth daily. For depression 10/18/19   Hongalgi, Lenis Dickinson, MD  busPIRone (BUSPAR) 5 MG tablet Take 1 tablet (5 mg total) by mouth 2 (two) times daily. For anxiety 08/12/19   Lindell Spar I, NP  Multiple Vitamin (MULTIVITAMIN WITH MINERALS) TABS tablet Take 1 tablet by mouth daily.    [provider]    polyethylene glycol (MIRALAX / GLYCOLAX) 17 g packet Take 17 g by mouth 2 (two) times daily. 10/18/19   Hongalgi, Lenis Dickinson, MD    Allergies    Codeine, Fentanyl citrate, Hydrocodone-acetaminophen, Oxycodone-acetaminophen, Penicillins, and Tramadol  Review of Systems   Review of Systems  Constitutional: Negative for fever.  HENT: Negative for sore throat.   Eyes: Positive for visual disturbance. Negative for photophobia and pain.  Respiratory: Negative for cough and shortness of breath.   Cardiovascular: Negative for chest pain.  Gastrointestinal: Negative for abdominal pain.  Genitourinary: Negative for dysuria.  Musculoskeletal: Negative for neck pain.  Skin: Negative for rash.  Neurological: Positive for headaches. Negative for focal weakness and loss of balance.    Physical Exam Updated Vital Signs BP (!) 189/85   Pulse 92   Temp 98.4 F (36.9 C) (Oral)   Resp 18   Ht 5\' 7"  (1.702 m)   Wt 63.5 kg   SpO2 100%   BMI 21.93 kg/m   Physical Exam Vitals and nursing note reviewed.  Constitutional:      General: She is not in acute distress.    Appearance: She is well-developed.  HENT:     Head: Normocephalic and atraumatic.  Eyes:     Conjunctiva/sclera: Conjunctivae normal.  Cardiovascular:     Rate and Rhythm: Normal rate and regular rhythm.     Heart sounds: No murmur.  Pulmonary:     Effort: Pulmonary effort is normal. No respiratory distress.     Breath sounds: Normal breath sounds.  Abdominal:     Palpations: Abdomen is soft.     Tenderness: There is no abdominal tenderness.  Musculoskeletal:        General: No deformity or signs of injury. Normal range of motion.     Cervical back: Neck supple.  Skin:    General: Skin is warm and dry.  Neurological:     General: No focal deficit present.     Mental Status: She is alert.     GCS: GCS eye subscore is 4. GCS verbal subscore is 5. GCS motor subscore is 6.     Cranial Nerves: No cranial nerve deficit.      Sensory: No sensory deficit.     Motor: No weakness.     Gait: Gait normal.     ED Results / Procedures / Treatments   Labs (all labs ordered are listed, but only abnormal results are displayed) Labs Reviewed  CBC WITH DIFFERENTIAL/PLATELET - Abnormal;  Notable for the following components:      Result Value   RBC 3.86 (*)    Hemoglobin 11.3 (*)    Platelets 518 (*)    All other components within normal limits  BASIC METABOLIC PANEL    EKG None  Radiology CT Head Wo Contrast  Result Date: 10/26/2019 CLINICAL DATA:  Headache, posttraumatic. Additional history provided by technologist: Patient reports headaches which he believes are due to a fall from a tree stand, patient unable to confirm date of accident, states she fell 40 feet, unable to recall if loss of consciousness. EXAM: CT HEAD WITHOUT CONTRAST TECHNIQUE: Contiguous axial images were obtained from the base of the skull through the vertex without intravenous contrast. COMPARISON:  No pertinent prior studies available for comparison. FINDINGS: Brain: There is extra-axial CSF density prominence within the anterior left middle cranial fossa also extending more superiorly along the left frontal and temporal lobes. This measures 5.5 x 4.6 x 6.6 cm (AP x TV x CC) and findings are most consistent with arachnoid cyst. There is associated mass effect upon the anterior left temporal lobe and to a lesser degree upon the left frontal lobe. Associated 2 mm rightward midline shift. There is no evidence of intracranial mass. No acute intracranial hemorrhage. No demarcated cortical infarct. Mild generalized parenchymal atrophy. Vascular: No hyperdense vessel. Skull: No depressed calvarial fracture. Sequela of previous right parietal burr hole. Additionally there is a metallic plate more anteriorly along the right frontoparietal calvarium. Sinuses/Orbits: Visualized orbits demonstrate no acute abnormality. No significant paranasal sinus disease or  mastoid effusion at the imaged levels. IMPRESSION: 5.5 x 4.6 x 6.6 cm arachnoid cyst centered within the anterior left middle cranial fossa. Mass effect upon the adjacent left temporal and frontal lobes. Associated 2 mm rightward midline shift. No evidence of acute intracranial abnormality. Mild generalized parenchymal atrophy. Electronically Signed   By: Jackey Loge DO   On: 10/26/2019 16:46    Procedures Procedures (including critical care time)  Medications Ordered in ED Medications - No data to display  ED Course  I have reviewed the triage vital signs and the nursing notes.  Pertinent labs & imaging results that were available during my care of the patient were reviewed by me and considered in my medical decision making (see chart for details).  Clinical Course as of Oct 26 1004  Tue Oct 26, 2019  1547 Differential includes posttraumatic headache, intracerebral hemorrhage, subdural, tension headache, hypertensive urgency   [MB]  1701 Reviewed prior records and it sounds like this arachnoid cyst has been there at least in 2010 when she had subdurals that required bur holes.   [MB]  1701 Discussed with Dr. Nori Riis neurosurgery who felt this was likely a stable finding and does not need any intervention.  He said she can follow-up in the clinic if she has any concerns.   [MB]  1728 Hemoglobin improved from discharge 8 days ago.  Chemistries unremarkable.   [MB]    Clinical Course User Index [MB] Terrilee Files, MD   MDM Rules/Calculators/A&P                       Final Clinical Impression(s) / ED Diagnoses Final diagnoses:  Generalized headache  Intracranial arachnoid cyst    Rx / DC Orders ED Discharge Orders    None       Terrilee Files, MD 10/27/19 1009

## 2019-10-28 DIAGNOSIS — M810 Age-related osteoporosis without current pathological fracture: Secondary | ICD-10-CM | POA: Diagnosis not present

## 2019-10-28 DIAGNOSIS — Z780 Asymptomatic menopausal state: Secondary | ICD-10-CM | POA: Diagnosis not present

## 2019-10-29 DIAGNOSIS — R030 Elevated blood-pressure reading, without diagnosis of hypertension: Secondary | ICD-10-CM | POA: Diagnosis not present

## 2019-10-29 DIAGNOSIS — G930 Cerebral cysts: Secondary | ICD-10-CM | POA: Diagnosis not present

## 2019-11-09 DIAGNOSIS — G930 Cerebral cysts: Secondary | ICD-10-CM | POA: Diagnosis not present

## 2019-11-11 ENCOUNTER — Ambulatory Visit
Admission: RE | Admit: 2019-11-11 | Discharge: 2019-11-11 | Disposition: A | Discharge status: Home / Self Care | Payer: BC Managed Care – PPO | Source: Ambulatory Visit | Attending: Vascular Surgery | Admitting: Vascular Surgery

## 2019-11-11 DIAGNOSIS — R109 Unspecified abdominal pain: Secondary | ICD-10-CM | POA: Diagnosis not present

## 2019-11-11 DIAGNOSIS — I998 Other disorder of circulatory system: Secondary | ICD-10-CM

## 2019-11-11 MED ORDER — IOPAMIDOL (ISOVUE-370) INJECTION 76%
75.0000 mL | Freq: Once | INTRAVENOUS | Status: AC | PRN
  Administered 2019-11-11: 75 mL via INTRAVENOUS

## 2019-11-16 ENCOUNTER — Other Ambulatory Visit: Payer: Self-pay

## 2019-11-16 ENCOUNTER — Ambulatory Visit (INDEPENDENT_AMBULATORY_CARE_PROVIDER_SITE_OTHER): Payer: Self-pay | Admitting: Vascular Surgery

## 2019-11-16 ENCOUNTER — Encounter: Payer: Self-pay | Admitting: Vascular Surgery

## 2019-11-16 VITALS — BP 140/83 | HR 59 | Temp 98.0°F | Resp 14 | Ht 67.0 in | Wt 145.0 lb

## 2019-11-16 DIAGNOSIS — R58 Hemorrhage, not elsewhere classified: Secondary | ICD-10-CM

## 2019-11-16 NOTE — Progress Notes (Signed)
Patient name: Shelley Hines MRN: [id number] DOB: 15-May-1966 Sex: female  REASON FOR VISIT: Postop check status post aortic stent x2  HPI: Shelley Hines is a 54 y.o. female who presents for postop check after infrarenal aortic stent graft repair using two aortic cuffs on 10/15/2019.  Ultimately I was called to IR to evaluate her during aortogram to evaluate for suspected blush off the infrarenal aorta with large RP hematoma.  At the time the etiology was very unclear as she denied any associated trauma.  Ultimately we took her for a left femoral access and placed two aortic stents in the infrarenal aorta and sealed her active extravasation off the aorta.  She now states that she remembers falling out of a deer stand in December and likely this was trauma related.  She has been recovering at home.  Still still has some left-sided pain where she had a RP hematoma.  Past Medical History:  Diagnosis Date  . Depression   . Primary hyperthyroidism     Past Surgical History:  Procedure Laterality Date  . ABDOMINAL AORTIC ENDOVASCULAR STENT GRAFT N/A 10/15/2019   Procedure: ABDOMINAL AORTIC ENDOVASCULAR STENT GRAFT;  Surgeon: Marty Heck, MD;  Location: [place];  Service: Vascular;  Laterality: N/A;  . BRAIN SURGERY    . CESAREAN SECTION    . IR RENAL SELECTIVE  UNI INC S&I MOD SED  10/15/2019  . IR US GUIDE VASC ACCESS RIGHT  10/15/2019  . IR US GUIDE VASC ACCESS RIGHT  10/15/2019  . IR VENOCAVAGRAM IVC  10/15/2019  . IR VENOGRAM RENAL UNI LEFT  10/15/2019    Family History  Problem Relation Age of Onset  . Thyroid disease Neg Hx     SOCIAL HISTORY: Social History   Tobacco Use  . Smoking status: Never Smoker  . Smokeless tobacco: Never Used  Substance Use Topics  . Alcohol use: Not Currently    Comment: occasionally    Allergies  Allergen Reactions  . Codeine Hives, Itching and Swelling  . Fentanyl Citrate Itching  . Hydrocodone-Acetaminophen Hives, Itching and Swelling   . Oxycodone-Acetaminophen Hives, Itching, Nausea And Vomiting and Swelling  . Penicillins Hives, Itching and Swelling  . Tramadol Hives, Itching and Swelling    Current Outpatient Medications  Medication Sig Dispense Refill  . acetaminophen (TYLENOL) 325 MG tablet Take 2 tablets (650 mg total) by mouth every 6 (six) hours as needed for mild pain, moderate pain or fever (or temp >/= 101 F).    Marland Kitchen aspirin EC 81 MG EC tablet Take 1 tablet (81 mg total) by mouth daily at 6 (six) AM. 30 tablet 0  . buPROPion (WELLBUTRIN XL) 150 MG 24 hr tablet Take 3 tablets (450 mg total) by mouth daily. For depression    . Multiple Vitamin (MULTIVITAMIN WITH MINERALS) TABS tablet Take 1 tablet by mouth daily.    . polyethylene glycol (MIRALAX / GLYCOLAX) 17 g packet Take 17 g by mouth 2 (two) times daily. 30 each 0  . busPIRone (BUSPAR) 5 MG tablet Take 1 tablet (5 mg total) by mouth 2 (two) times daily. For anxiety 60 tablet 0   No current facility-administered medications for this visit.    REVIEW OF SYSTEMS:  [X]  denotes positive finding, [ ]  denotes negative finding Cardiac  Comments:  Chest pain or chest pressure:    Shortness of breath upon exertion:    Short of breath when lying flat:    Irregular heart rhythm:  Vascular    Pain in calf, thigh, or hip brought on by ambulation:    Pain in feet at night that wakes you up from your sleep:     Blood clot in your veins:    Leg swelling:         Pulmonary    Oxygen at home:    Productive cough:     Wheezing:         Neurologic    Sudden weakness in arms or legs:     Sudden numbness in arms or legs:     Sudden onset of difficulty speaking or slurred speech:    Temporary loss of vision in one eye:     Problems with dizziness:         Gastrointestinal    Blood in stool:     Vomited blood:         Genitourinary    Burning when urinating:     Blood in urine:        Psychiatric    Major depression:         Hematologic    Bleeding  problems:    Problems with blood clotting too easily:        Skin    Rashes or ulcers:        Constitutional    Fever or chills:      PHYSICAL EXAM: Vitals:   11/16/19 1534  BP: 140/83  Pulse: (!) 59  Resp: 14  Temp: 98 F (36.7 C)  TempSrc: Temporal  SpO2: 100%  Weight: 145 lb (65.8 kg)  Height: 5\' 7"  (1.702 m)    GENERAL: The patient is a well-nourished female, in no acute distress. The vital signs are documented above. CARDIAC: There is a regular rate and rhythm.  VASCULAR:  Palpable femoral pulses bilaterally Abdomen soft   DATA:   I independently reviewed her CTA abdomen pelvis and the aortic stents are well-positioned in the infrarenal aorta.  I see no active extravasation off the infrarenal aorta.  Good seal with stent position.  The left retroperitoneal hematoma is getting smaller.    Assessment/Plan:  70 old female status post infrarenal aortic stent graft x2 for active blush off the infrarenal aorta.  I now suspect this may be related to trauma since she remembers falling out of a deer stand.  In reviewing CTA today the stent grafts look well positioned with good seal and no evidence of ongoing blush.  I will plan to see her back in 1 year with a repeat CTA.  Discussed she call with questions or concerns.  She is worried about adnexal cyst noted on CT.  Discussed incidental finding and likely requires no intervention but will refer to ob-gyn for further recommendations.   56, MD [facility] Office: [phone]

## 2019-12-03 ENCOUNTER — Ambulatory Visit: Payer: BC Managed Care – PPO | Attending: Internal Medicine

## 2019-12-03 DIAGNOSIS — Z23 Encounter for immunization: Secondary | ICD-10-CM

## 2019-12-03 NOTE — Progress Notes (Signed)
   Covid-19 Vaccination Clinic  Name:  Shelley Hines    MRN: [id number] DOB: 17-Jun-1966  12/03/2019  [name] was observed post Covid-19 immunization for 15 minutes without incident. She was provided with Vaccine Information Sheet and instruction to access the V-Safe system.   Ms. Blow was instructed to call 911 with any severe reactions post vaccine: Marland Kitchen Difficulty breathing  . Swelling of face and throat  . A fast heartbeat  . A bad rash all over body  . Dizziness and weakness   Immunizations Administered    Name Date Dose VIS Date Route   Moderna COVID-19 Vaccine 12/03/2019 12:28 PM 0.5 mL 08/03/2019 Intramuscular   Manufacturer: Moderna   Lot: [id number]   NDC: 09381-829-93

## 2019-12-14 DIAGNOSIS — F332 Major depressive disorder, recurrent severe without psychotic features: Secondary | ICD-10-CM | POA: Diagnosis not present

## 2019-12-14 DIAGNOSIS — Z6822 Body mass index (BMI) 22.0-22.9, adult: Secondary | ICD-10-CM | POA: Diagnosis not present

## 2019-12-14 DIAGNOSIS — D62 Acute posthemorrhagic anemia: Secondary | ICD-10-CM | POA: Diagnosis not present

## 2019-12-14 DIAGNOSIS — R030 Elevated blood-pressure reading, without diagnosis of hypertension: Secondary | ICD-10-CM | POA: Diagnosis not present

## 2019-12-14 DIAGNOSIS — K661 Hemoperitoneum: Secondary | ICD-10-CM | POA: Diagnosis not present

## 2019-12-14 DIAGNOSIS — L659 Nonscarring hair loss, unspecified: Secondary | ICD-10-CM | POA: Diagnosis not present

## 2020-01-04 ENCOUNTER — Ambulatory Visit: Payer: BC Managed Care – PPO | Attending: Internal Medicine

## 2020-01-04 DIAGNOSIS — Z23 Encounter for immunization: Secondary | ICD-10-CM

## 2020-01-04 NOTE — Progress Notes (Signed)
   Covid-19 Vaccination Clinic  Name:  Shelley Hines    MRN: [id number] DOB: July 20, 1966  01/04/2020  [name] was observed post Covid-19 immunization for 15 minutes without incident. She was provided with Vaccine Information Sheet and instruction to access the V-Safe system.   Ms. Games was instructed to call 911 with any severe reactions post vaccine: Marland Kitchen Difficulty breathing  . Swelling of face and throat  . A fast heartbeat  . A bad rash all over body  . Dizziness and weakness   Immunizations Administered    Name Date Dose VIS Date Route   Moderna COVID-19 Vaccine 01/04/2020 12:21 PM 0.5 mL 08/2019 Intramuscular   Manufacturer: Moderna   Lot: [id number]   NDC: 08022-336-12

## 2020-04-05 DIAGNOSIS — I8312 Varicose veins of left lower extremity with inflammation: Secondary | ICD-10-CM | POA: Diagnosis not present

## 2020-04-05 DIAGNOSIS — I8311 Varicose veins of right lower extremity with inflammation: Secondary | ICD-10-CM | POA: Diagnosis not present

## 2020-04-17 DIAGNOSIS — I83813 Varicose veins of bilateral lower extremities with pain: Secondary | ICD-10-CM | POA: Diagnosis not present

## 2020-05-10 DIAGNOSIS — R05 Cough: Secondary | ICD-10-CM | POA: Diagnosis not present

## 2020-05-10 DIAGNOSIS — Z20828 Contact with and (suspected) exposure to other viral communicable diseases: Secondary | ICD-10-CM | POA: Diagnosis not present

## 2020-05-10 DIAGNOSIS — J0101 Acute recurrent maxillary sinusitis: Secondary | ICD-10-CM | POA: Diagnosis not present

## 2020-05-26 IMAGING — CT CT CTA ABD/PEL W/CM AND/OR W/O CM
2 of 10 series · 10 of 46 positions shown, 11 images · IV contrast (iopamidol)
Comparison: CT abdomen and pelvis-10/15/2019; abdominal
aortogram-10/16/2019

CLINICAL DATA: Post endovascular repair of aortic injury, now with
persistent abdominal pain. Evaluate for ischemia.

EXAM:
CTA ABDOMEN AND PELVIS WITHOUT AND WITH CONTRAST
TECHNIQUE: Multidetector CT imaging of the abdomen and pelvis was performed
using the standard protocol during bolus administration of
intravenous contrast. Multiplanar reconstructed images and MIPs were
obtained and reviewed to evaluate the vascular anatomy.
CONTRAST:  75mL AZPJJZ-G7F IOPAMIDOL (AZPJJZ-G7F) INJECTION 76%

[Series 8: cta arterial 2.00 bv36 s3 cor art st · axial · arterial · 0.48mm/px · z∈[+1178,+1538]mm · 8 of 232 slices shown, 9 images (1 of 2)]
[im 26/232  soft-tissue]
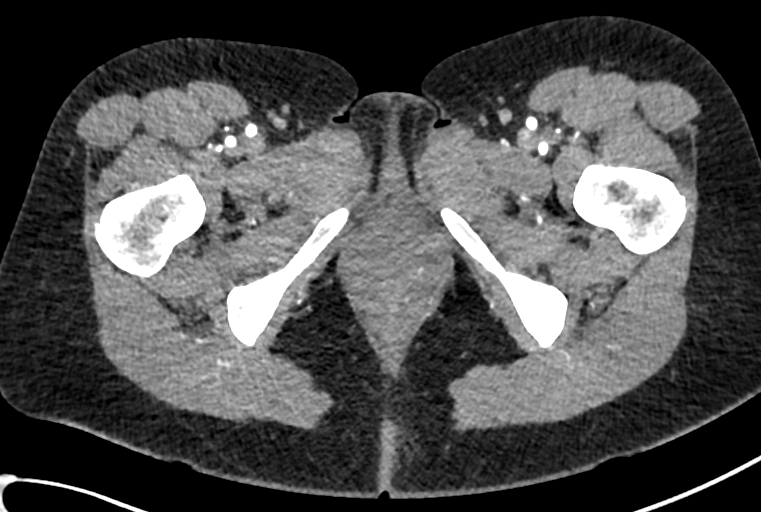
[im 26/232  bone]
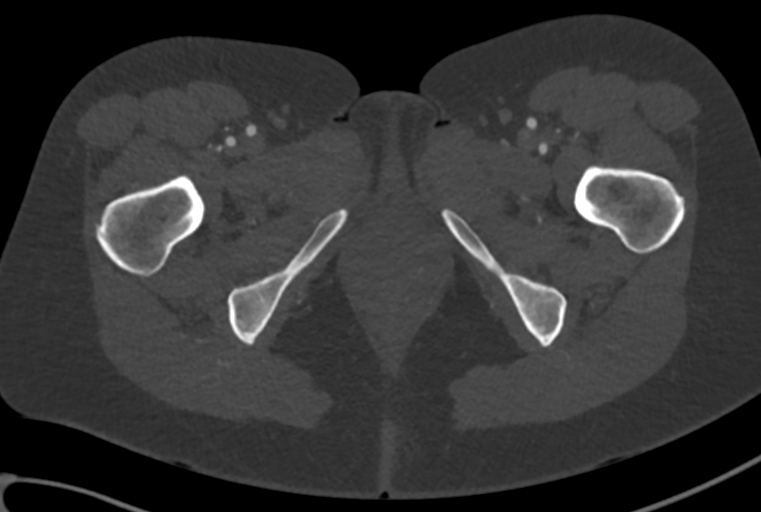
[im 52/232  soft-tissue]
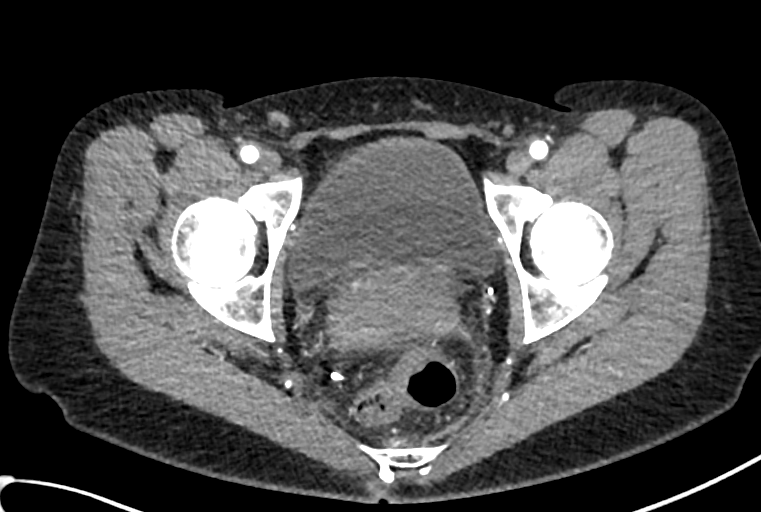
[im 78/232  soft-tissue]
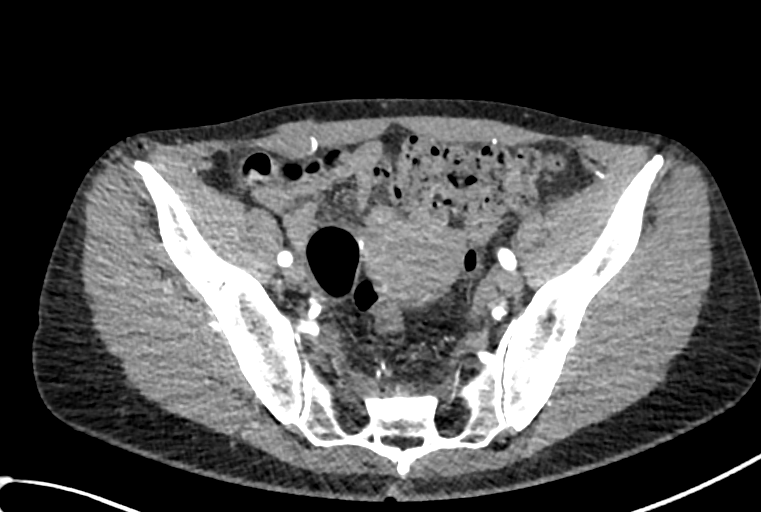
[im 103/232  soft-tissue]
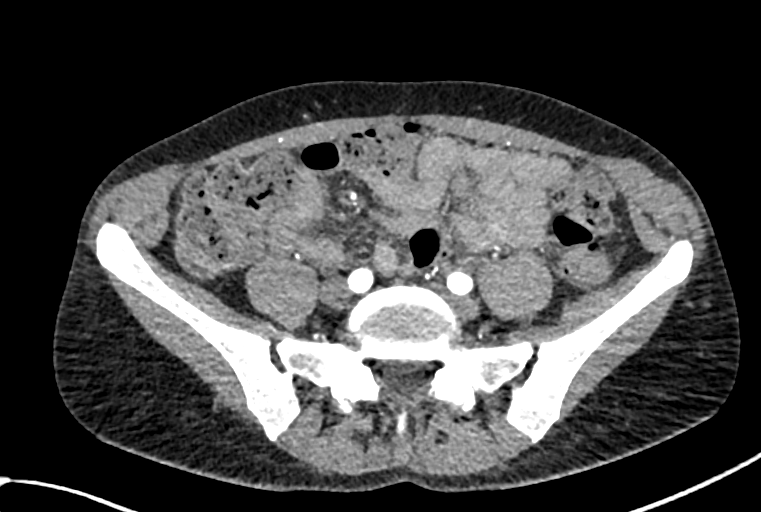
[im 129/232  soft-tissue]
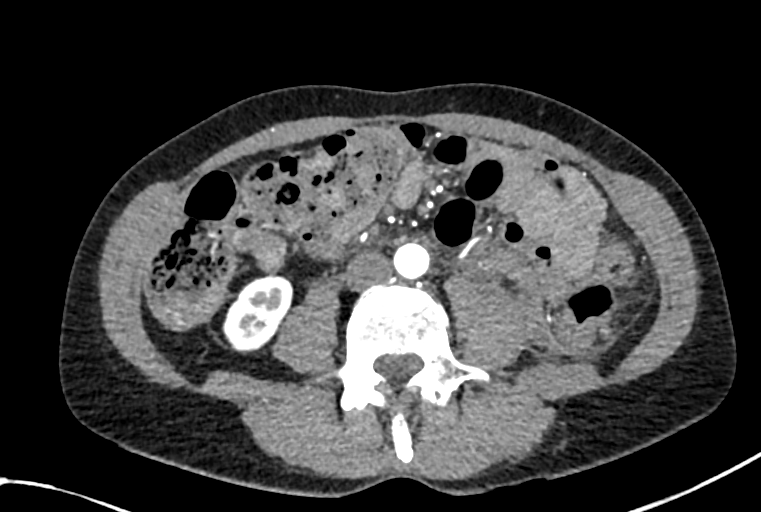
[im 155/232  soft-tissue]
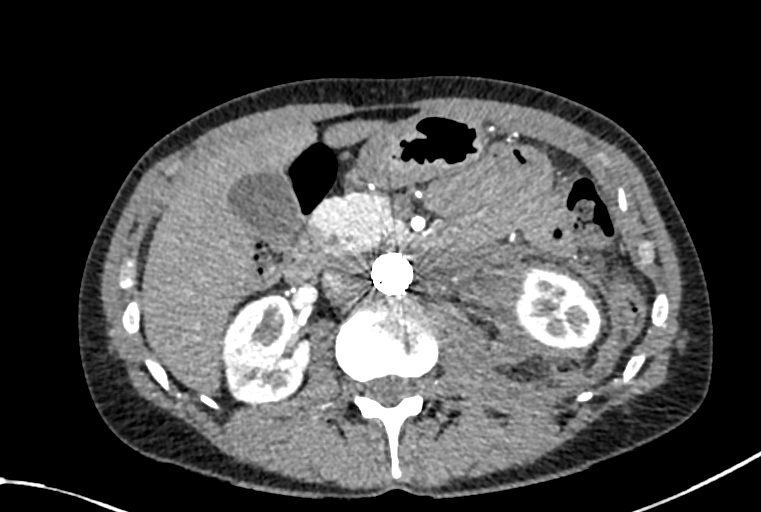
[im 180/232  soft-tissue]
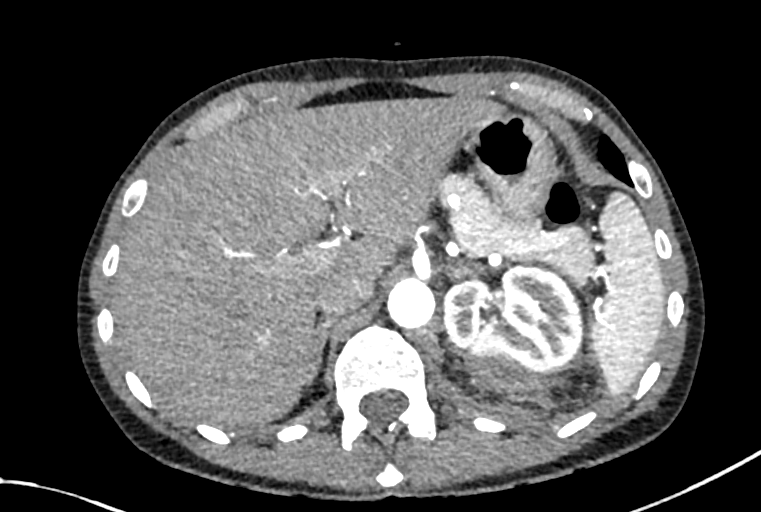
[im 206/232  soft-tissue]
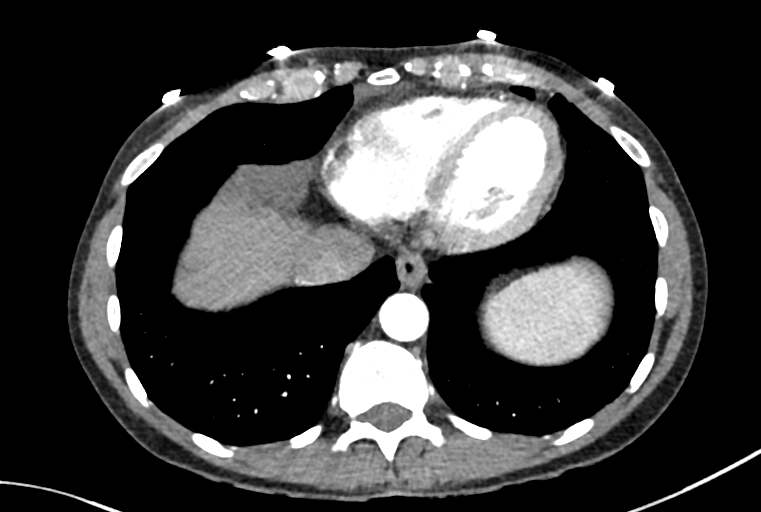

[Series 10: cta arterial 2.00 bv36 s3 cor art st · coronal · arterial · 0.72mm/px · 2 of 124 slices shown (2 of 2)]
[im 42/124  soft-tissue]
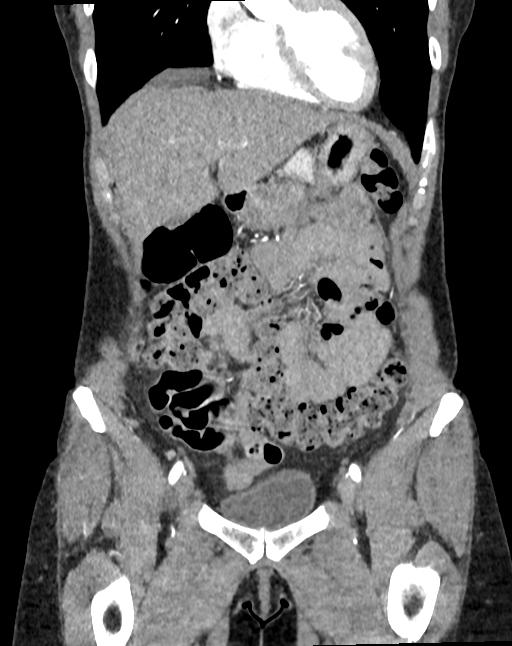
[im 83/124  soft-tissue]
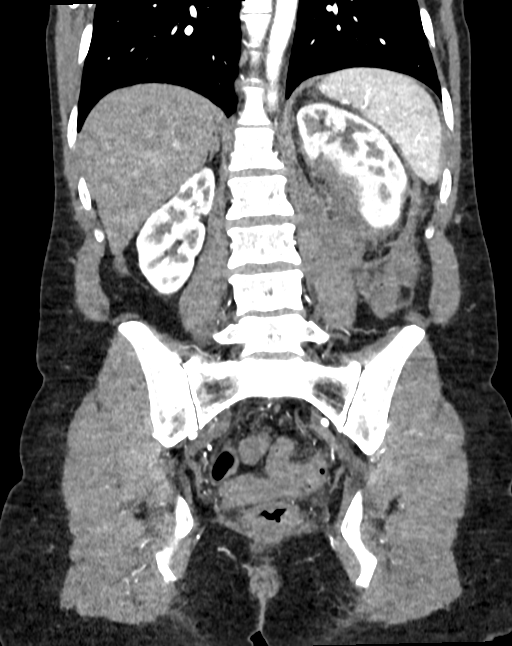

[10 of 46 positions shown; findings below may reference images not displayed]

FINDINGS: VASCULAR

Aorta: Post endovascular repair of the infrarenal abdominal aorta.
The stent graft appears well apposed against the walls of the
abdominal aorta and widely patent without evidence of in stent
stenosis. No discrete areas of residual contrast extravasation.

The remainder of the abdominal aorta appears normal without
atherosclerotic plaque. No evidence of abdominal aortic dissection
or periaortic stranding.

Celiac: Widely patent without hemodynamically significant narrowing.
Conventional branching pattern. There is beaded irregularity
involving the common hepatic artery (representative axial image 58,
series 8; coronal images 55 through 57, series 10, nonspecific
though could represent an area of fibromuscular dysplasia. The
splenic artery appears patent without evidence of aneurysm.

SMA: Widely patent without hemodynamically significant narrowing.
Conventional branching pattern. The distal tributaries of the SMA
appear widely patent without discrete intraluminal filling defect to
suggest distal embolism.

Renals: Solitary bilaterally; the bilateral renal arteries are
widely patent without a hemodynamically significant narrowing. No
discrete areas of vessel irregularity to suggest FMD.

IMA: Occluded at its origin, an expected finding following aortic
stent graft repair, with early reconstitution via collateral supply
from the SMA.

Inflow: The bilateral common iliac arteries are of normal caliber
and widely patent without a hemodynamically significant narrowing.

There is mild beaded irregularity involving the bilateral external
iliac arteries, right greater than left (representative axial images
144, 161, 162, 165; series 8; coronal images 68 48, 49, 67 and 68,
series 10).

Mild focal ectasia involving the right internal iliac artery
measuring 1 cm in diameter (coronal image 74, series 10). The left
internal iliac artery is of normal caliber and widely patent.

Proximal Outflow: The bilateral common and imaged portions of the
bilateral deep and superficial femoral arteries are of normal
caliber and widely patent without hemodynamically significant
narrowing or evidence of vessel irregularity.

Veins: The IVC and pelvic venous systems appear widely patent.

Review of the MIP images confirms the above findings.

_________________________________________________________

NON-VASCULAR

Lower chest: Limited visualization of the lower thorax demonstrates
minimal dependent subpleural ground-glass atelectasis. No discrete
focal airspace opacities. No pleural effusion.

Normal heart size.  No pericardial effusion.

Hepatobiliary: Normal hepatic contour. Redemonstrated approximately
1.2 cm hypoattenuating minimally complex cyst within the dome of the
left lobe of the liver. No additional discrete hepatic lesions.
Normal appearance of the gallbladder given degree distention. No
radiopaque gallstones. No intra or extrahepatic biliary ductal
dilatation. No ascites.

Pancreas: Normal appearance of the pancreas.

Spleen: Normal appearance of the spleen.

Adrenals/Urinary Tract: There is improved though persistent
displacement of the left kidney anterior and lateral secondary to
residual left-sided perinephric hematoma though overall the
left-sided perinephric hematoma has reduced in size compared to the
10/15/2019 examination.

Residual peripherally enhancing of evolving hematomas are difficult
to measure though dominant component about the inferior medial
aspect of the left kidney measures approximately 6.4 x 4.2 x 5.7 cm
(axial image 27, series 18; coronal image 38, series 20) and cranial
component measuring approximately 5.4 x 2.7 x 5.5 cm (axial image
16, series 18; coronal image 46, series 20).

No nephrolithiasis or urinary obstruction. Note is again made of
duplicated renal collecting systems bilaterally. Normal appearance
of the bilateral adrenal glands. Normal appearance of the urinary
bladder given degree of distention.

Stomach/Bowel: Moderate to large colonic stool burden without
evidence of enteric obstruction. High density debris is seen within
the terminal ileum and cecum on the precontrast images. No discrete
areas of intraluminal contrast extravasation to suggest GI bleeding.
No discrete areas of bowel wall thickening, though note, examination
is somewhat degraded secondary to lack of significant mesenteric
fat. No pneumoperitoneum, pneumatosis or portal venous gas.

Lymphatic: No definite bulky retroperitoneal, mesenteric, pelvic or
inguinal lymphadenopathy.

Reproductive: Note is made of an approximately 3.4 x 2.3 cm
hypoattenuating presumably physiologic left-sided adnexal cyst
(image 149, series 8).

Other: Regional soft tissues appear normal.

Musculoskeletal: No acute or aggressive osseous abnormalities.
IMPRESSION: 1. Post endovascular repair of infrarenal abdominal aorta without
evidence of complication. Specifically, there are no residual areas
of contrast extravasation.
2. Findings suggestive of FMD affecting the common hepatic and
bilateral external iliac arteries as detailed above.
3. Expected occlusion of the origin of the IMA following stent graft
repair, however there is early reconstitution of the IMA via
collateral supply from the SMA and there is no CT evidence of
mesenteric ischemia.
4. Interval reduction in size of left-sided perinephric hematoma
with residual evolving hematomas measuring approximately 6.4 and
cm as detailed above.

## 2020-08-08 DIAGNOSIS — Z23 Encounter for immunization: Secondary | ICD-10-CM | POA: Diagnosis not present

## 2020-08-08 DIAGNOSIS — Z0000 Encounter for general adult medical examination without abnormal findings: Secondary | ICD-10-CM | POA: Diagnosis not present

## 2020-08-08 DIAGNOSIS — Z1389 Encounter for screening for other disorder: Secondary | ICD-10-CM | POA: Diagnosis not present

## 2020-08-08 DIAGNOSIS — Z6823 Body mass index (BMI) 23.0-23.9, adult: Secondary | ICD-10-CM | POA: Diagnosis not present

## 2020-08-08 DIAGNOSIS — Z1331 Encounter for screening for depression: Secondary | ICD-10-CM | POA: Diagnosis not present

## 2020-08-15 DIAGNOSIS — Z23 Encounter for immunization: Secondary | ICD-10-CM | POA: Diagnosis not present

## 2020-10-12 ENCOUNTER — Other Ambulatory Visit: Payer: Self-pay | Admitting: *Deleted

## 2020-10-12 DIAGNOSIS — R58 Hemorrhage, not elsewhere classified: Secondary | ICD-10-CM

## 2020-11-21 ENCOUNTER — Ambulatory Visit: Payer: 59 | Admitting: Vascular Surgery

## 2020-11-21 ENCOUNTER — Other Ambulatory Visit: Payer: Self-pay

## 2020-11-21 DIAGNOSIS — R58 Hemorrhage, not elsewhere classified: Secondary | ICD-10-CM

## 2020-11-28 ENCOUNTER — Other Ambulatory Visit: Payer: Self-pay | Admitting: Physician Assistant

## 2020-11-28 DIAGNOSIS — Z1231 Encounter for screening mammogram for malignant neoplasm of breast: Secondary | ICD-10-CM

## 2020-11-29 ENCOUNTER — Ambulatory Visit
Admission: RE | Admit: 2020-11-29 | Discharge: 2020-11-29 | Disposition: A | Discharge status: Home / Self Care | Payer: 59 | Source: Ambulatory Visit | Attending: Vascular Surgery | Admitting: Vascular Surgery

## 2020-11-29 DIAGNOSIS — R58 Hemorrhage, not elsewhere classified: Secondary | ICD-10-CM

## 2020-11-29 MED ORDER — IOPAMIDOL (ISOVUE-370) INJECTION 76%
75.0000 mL | Freq: Once | INTRAVENOUS | Status: AC | PRN
  Administered 2020-11-29: 75 mL via INTRAVENOUS

## 2020-12-05 ENCOUNTER — Ambulatory Visit: Payer: 59 | Admitting: Vascular Surgery

## 2020-12-12 ENCOUNTER — Ambulatory Visit (INDEPENDENT_AMBULATORY_CARE_PROVIDER_SITE_OTHER): Payer: 59 | Admitting: Vascular Surgery

## 2020-12-12 ENCOUNTER — Other Ambulatory Visit: Payer: Self-pay

## 2020-12-12 ENCOUNTER — Encounter: Payer: Self-pay | Admitting: Vascular Surgery

## 2020-12-12 VITALS — BP 116/65 | HR 42 | Temp 97.7°F | Ht 68.0 in | Wt 147.6 lb

## 2020-12-12 DIAGNOSIS — R58 Hemorrhage, not elsewhere classified: Secondary | ICD-10-CM | POA: Diagnosis not present

## 2020-12-12 NOTE — Progress Notes (Signed)
Patient name: Shelley Hines MRN: [id number] DOB: [birth year] Sex: female  REASON FOR VISIT: 1 year follow-up for infra-renal aortic stent  HPI: Shelley Hines is a 55 y.o. female who presents for 1 year follow-up after infrarenal aortic stent graft repair using two aortic cuffs on 10/15/2019.  Ultimately I was called to IR to evaluate her during aortogram to evaluate for suspected blush off the infrarenal aorta with large RP hematoma.  At the time the etiology was very unclear as she denied any associated trauma.  Ultimately we took her for a left femoral access and placed two aortic stents in the infrarenal aorta and sealed her active extravasation off the aorta.  She now states that she remembers falling out of a deer stand.  She reports no new issues on follow-up today.  She did get a CT scan prior to the visit.  Past Medical History:  Diagnosis Date  . Depression   . Primary hyperthyroidism     Past Surgical History:  Procedure Laterality Date  . ABDOMINAL AORTIC ENDOVASCULAR STENT GRAFT N/A 10/15/2019   Procedure: ABDOMINAL AORTIC ENDOVASCULAR STENT GRAFT;  Surgeon: Cephus Shelling, MD;  Location: [facility] OR;  Service: Vascular;  Laterality: N/A;  . BRAIN SURGERY    . CESAREAN SECTION    . IR RENAL SELECTIVE  UNI INC S&I MOD SED  10/15/2019  . IR US GUIDE VASC ACCESS RIGHT  10/15/2019  . IR US GUIDE VASC ACCESS RIGHT  10/15/2019  . IR VENOCAVAGRAM IVC  10/15/2019  . IR VENOGRAM RENAL UNI LEFT  10/15/2019    Family History  Problem Relation Age of Onset  . Thyroid disease Neg Hx     SOCIAL HISTORY: Social History   Tobacco Use  . Smoking status: Never Smoker  . Smokeless tobacco: Never Used  Substance Use Topics  . Alcohol use: Not Currently    Comment: occasionally    Allergies  Allergen Reactions  . Codeine Hives, Itching and Swelling  . Fentanyl Citrate Itching  . Hydrocodone-Acetaminophen Hives, Itching and Swelling  . Oxycodone-Acetaminophen Hives, Itching,  Nausea And Vomiting and Swelling  . Penicillins Hives, Itching and Swelling  . Tramadol Hives, Itching and Swelling    Current Outpatient Medications  Medication Sig Dispense Refill  . acetaminophen (TYLENOL) 325 MG tablet Take 2 tablets (650 mg total) by mouth every 6 (six) hours as needed for mild pain, moderate pain or fever (or temp >/= 101 F).    Marland Kitchen aspirin EC 81 MG EC tablet Take 1 tablet (81 mg total) by mouth daily at 6 (six) AM. 30 tablet 0  . buPROPion (WELLBUTRIN XL) 150 MG 24 hr tablet Take 3 tablets (450 mg total) by mouth daily. For depression    . busPIRone (BUSPAR) 5 MG tablet Take 1 tablet (5 mg total) by mouth 2 (two) times daily. For anxiety 60 tablet 0  . Multiple Vitamin (MULTIVITAMIN WITH MINERALS) TABS tablet Take 1 tablet by mouth daily.    . polyethylene glycol (MIRALAX / GLYCOLAX) 17 g packet Take 17 g by mouth 2 (two) times daily. (Patient not taking: Reported on 12/12/2020) 30 each 0   No current facility-administered medications for this visit.    REVIEW OF SYSTEMS:  [X]  denotes positive finding, [ ]  denotes negative finding Cardiac  Comments:  Chest pain or chest pressure:    Shortness of breath upon exertion:    Short of breath when lying flat:    Irregular heart rhythm:  Vascular    Pain in calf, thigh, or hip brought on by ambulation:    Pain in feet at night that wakes you up from your sleep:     Blood clot in your veins:    Leg swelling:         Pulmonary    Oxygen at home:    Productive cough:     Wheezing:         Neurologic    Sudden weakness in arms or legs:     Sudden numbness in arms or legs:     Sudden onset of difficulty speaking or slurred speech:    Temporary loss of vision in one eye:     Problems with dizziness:         Gastrointestinal    Blood in stool:     Vomited blood:         Genitourinary    Burning when urinating:     Blood in urine:        Psychiatric    Major depression:         Hematologic    Bleeding  problems:    Problems with blood clotting too easily:        Skin    Rashes or ulcers:        Constitutional    Fever or chills:      PHYSICAL EXAM: Vitals:   12/12/20 1038  BP: 116/65  Pulse: (!) 42  Temp: 97.7 F (36.5 C)  TempSrc: Skin  SpO2: 99%  Weight: 147 lb 9.6 oz (67 kg)  Height: 5\' 8"  (1.727 m)    GENERAL: The patient is a well-nourished female, in no acute distress. The vital signs are documented above. CARDIAC: There is a regular rate and rhythm.  VASCULAR:  Palpable femoral pulses bilaterally Abdomen soft  DATA:   CTA abdomen pelvis 11/29/2020 shows well-positioned infrarenal aortic stent graft with no abnormal findings and good seal.  Assessment/Plan:  21 old female status post infrarenal aortic stent graft x2 for active blush off the infrarenal aorta on 10/15/19 that presents for one year follow-up.  We later identified that she had a fall from a deer stand and I suspect this may have been traumatic.  CT shows excellent position of the stent graft with no abnormality or other concerning findings.  RP hematoma has resolved.  I have no concerns.  Discussed we could follow up again in 1 year in the PA clinic with EVAR duplex.   12/13/19, MD [facility] Office: [phone]

## 2021-01-17 ENCOUNTER — Ambulatory Visit
Admission: RE | Admit: 2021-01-17 | Discharge: 2021-01-17 | Disposition: A | Discharge status: Home / Self Care | Payer: 59 | Source: Ambulatory Visit | Attending: Physician Assistant | Admitting: Physician Assistant

## 2021-01-17 ENCOUNTER — Other Ambulatory Visit: Payer: Self-pay

## 2021-01-17 DIAGNOSIS — Z1231 Encounter for screening mammogram for malignant neoplasm of breast: Secondary | ICD-10-CM

## 2021-05-02 ENCOUNTER — Ambulatory Visit (INDEPENDENT_AMBULATORY_CARE_PROVIDER_SITE_OTHER): Payer: 59

## 2021-05-02 ENCOUNTER — Other Ambulatory Visit: Payer: Self-pay

## 2021-05-02 ENCOUNTER — Ambulatory Visit (INDEPENDENT_AMBULATORY_CARE_PROVIDER_SITE_OTHER): Payer: 59 | Admitting: Podiatry

## 2021-05-02 DIAGNOSIS — M2041 Other hammer toe(s) (acquired), right foot: Secondary | ICD-10-CM

## 2021-05-02 DIAGNOSIS — M21621 Bunionette of right foot: Secondary | ICD-10-CM

## 2021-05-02 DIAGNOSIS — M2042 Other hammer toe(s) (acquired), left foot: Secondary | ICD-10-CM

## 2021-05-02 DIAGNOSIS — M2011 Hallux valgus (acquired), right foot: Secondary | ICD-10-CM | POA: Diagnosis not present

## 2021-05-02 NOTE — Progress Notes (Signed)
   Subjective: 55 y.o. female presenting today as a new patient for evaluation of the symptomatic bunion and hammertoe to the right foot that has been present for several months.  Patient has noticed slow progression over the course of several years.  She says that now her second toe is overlapping over her bunion area.  Is very symptomatic and painful with ambulation despite different shoe gear.  She presents for further treatment and evaluation and to discuss surgery this time.  She is otherwise healthy  Past Medical History:  Diagnosis Date   Depression    Primary hyperthyroidism     Objective: Physical Exam General: The patient is alert and oriented x3 in no acute distress.  Dermatology: Skin is cool, dry and supple bilateral lower extremities. Negative for open lesions or macerations.  Vascular: Palpable pedal pulses bilaterally. No edema or erythema noted. Capillary refill within normal limits.  Neurological: Epicritic and protective threshold grossly intact bilaterally.   Musculoskeletal Exam: Clinical evidence of bunion deformity noted to the respective foot. There is moderate pain on palpation range of motion of the first MPJ. Lateral deviation of the hallux noted consistent with hallux abductovalgus. Hammertoe contracture at the level of the MTPJ also noted on clinical exam to digits #2 of the right foot.  The PIPJ and DIPJ are somewhat in a rectus alignment with the apex of the deformity more at the MTPJ due to the elongated second metatarsal as visualized on radiographic exam symptomatic pain on palpation and range of motion also noted to the metatarsal phalangeal joints of the respective hammertoe digits.  Clinical evidence of a tailor's bunion deformity with a hypertrophic lateral deviation of the fifth metatarsal head  Radiographic Exam: Increased intermetatarsal angle greater than 15 with a hallux abductus angle greater than 30 noted on AP view. Moderate degenerative changes  noted within the first MPJ.  Very mild contracture deformity also noted to the interphalangeal joints and mostly of the MPJ of the second digit.  Increased intermetatarsal angle with lateral deviation of the fifth metatarsal head also noted on AP view consistent with tailor's bunion deformity.  Elongated second metatarsal past the normal metatarsal parabola also noted which is contributory to the hammertoe deformity.    Assessment: 1. HAV w/ bunion deformity right 2. Hammertoe deformity second digit right 3.  Tailor's bunion deformity right   Plan of Care:  1. Patient was evaluated. X-Rays reviewed. 2. Today we discussed the conservative versus surgical management of the presenting pathology. The patient opts for surgical management. All possible complications and details of the procedure were explained. All patient questions were answered. No guarantees were expressed or implied. 3. Authorization for surgery was initiated today. Surgery will consist of bunionectomy with first metatarsal osteotomy right.  MTPJ capsulotomy with Weil shortening osteotomy second right.  Tailor's bunionectomy with fifth metatarsal osteotomy right. 4.  Return to clinic 1 week postop     Felecia Shelling, DPM [facility]  Dr. Felecia Shelling, DPM    [address] [place], [place] [address]                Office [phone]  Fax [phone]

## 2021-06-21 ENCOUNTER — Telehealth: Payer: Self-pay

## 2021-06-21 NOTE — Telephone Encounter (Signed)
Pt called with questions about pain medication that she had been prescribed from our office for a previous surgery. She is having an upcoming cosmetic surgery and is allergic to several narcotics. I did not see any previously prescribed pain meds. Advised her to call the hospital in [place] where she had prior surgery or call her PCP. No further questions at this time.

## 2021-07-09 ENCOUNTER — Telehealth: Payer: Self-pay | Admitting: Urology

## 2021-07-09 NOTE — Telephone Encounter (Signed)
DOS - 08/02/21  [place] BUNIONECTOMY RIGHT --- 38377 METATARSAL OSTEOTOMY 2,5 RIGHT --- 93968  [employer] EFFECTIVE DATE - 09/02/20  RECEIVED FAX FROM [facility] STATING THAT CPT CODE 86484 AND 28308 HAVE BEEN APPROVED, AUTH # [id number], GOOD FROM 08/02/21 - 08/02/21.

## 2021-08-02 ENCOUNTER — Encounter: Payer: Self-pay | Admitting: Podiatry

## 2021-08-02 ENCOUNTER — Other Ambulatory Visit: Payer: Self-pay | Admitting: Podiatry

## 2021-08-02 DIAGNOSIS — M2011 Hallux valgus (acquired), right foot: Secondary | ICD-10-CM | POA: Diagnosis not present

## 2021-08-02 DIAGNOSIS — M21541 Acquired clubfoot, right foot: Secondary | ICD-10-CM | POA: Diagnosis not present

## 2021-08-02 DIAGNOSIS — M2041 Other hammer toe(s) (acquired), right foot: Secondary | ICD-10-CM

## 2021-08-02 MED ORDER — HYDROMORPHONE HCL 2 MG PO TABS: 2.0000 mg | ORAL_TABLET | ORAL | 0 refills | Status: DC | PRN

## 2021-08-02 MED ORDER — PROMETHAZINE HCL 25 MG PO TABS: 25.0000 mg | ORAL_TABLET | Freq: Three times a day (TID) | ORAL | 0 refills | Status: DC | PRN

## 2021-08-02 MED ORDER — IBUPROFEN 800 MG PO TABS: 800.0000 mg | ORAL_TABLET | Freq: Three times a day (TID) | ORAL | 1 refills | Status: DC

## 2021-08-02 NOTE — Progress Notes (Signed)
PRN postop 

## 2021-08-08 ENCOUNTER — Other Ambulatory Visit: Payer: Self-pay

## 2021-08-08 ENCOUNTER — Ambulatory Visit (INDEPENDENT_AMBULATORY_CARE_PROVIDER_SITE_OTHER): Payer: 59 | Admitting: Podiatry

## 2021-08-08 ENCOUNTER — Ambulatory Visit (INDEPENDENT_AMBULATORY_CARE_PROVIDER_SITE_OTHER): Payer: 59

## 2021-08-08 DIAGNOSIS — Z9889 Other specified postprocedural states: Secondary | ICD-10-CM | POA: Diagnosis not present

## 2021-08-08 NOTE — Progress Notes (Signed)
   Subjective:  Patient presents today status post right bunionectomy with double osteotomy and tailor's bunionectomy as well as second metatarsal Weil shortening osteotomy. DOS: 08/02/2021.  Patient states that she has been walking significantly on her foot and not reducing her activity over the past week.  She states that she has not been icing her foot either she has noticed heavy amount of swelling and bleeding to the dressings.  She says that she has been caring about her normal activities and routines and decorating for the holidays and cooking dinners.  She has been weightbearing in the boot.  She presents POV #1  Past Medical History:  Diagnosis Date   Depression    Primary hyperthyroidism       Objective/Physical Exam Neurovascular status intact.  Skin incisions appear to be well coapted with sutures intact. No sign of infectious process noted. No dehiscence. No active bleeding noted.  There is a heavy amount of edema noted throughout the foot likely due to the patient's noncompliance and excessive walking throughout the past week and not reducing her activity.  Radiographic Exam:  Orthopedic hardware and osteotomies sites appear to be stable with routine healing.  Slight bending of the K wire noted second ray  Assessment: 1. s/p right forefoot surgery. DOS: 08/02/2021   Plan of Care:  1. Patient was evaluated. X-rays reviewed 2.  Stressed the importance of reducing activity and elevating the foot is much as possible.  Unfortunate the patient has a heavy amount of edema to the foot which will likely prolong the healing.  And I explained the edema may stay in the foot for several months postoperatively 3.  Recommend ice daily 4.  Continue minimal weightbearing in the cam boot 5.  Return to clinic in 1 week   Felecia Shelling, DPM [facility]  Dr. Felecia Shelling, DPM    [address] [place], [place] [address]                 Office [phone]  Fax [phone]

## 2021-08-15 ENCOUNTER — Ambulatory Visit (INDEPENDENT_AMBULATORY_CARE_PROVIDER_SITE_OTHER): Payer: 59 | Admitting: Podiatry

## 2021-08-15 ENCOUNTER — Other Ambulatory Visit: Payer: Self-pay

## 2021-08-15 DIAGNOSIS — Z9889 Other specified postprocedural states: Secondary | ICD-10-CM

## 2021-08-15 NOTE — Progress Notes (Signed)
° °  Subjective:  Patient presents today status post right bunionectomy with double osteotomy and tailor's bunionectomy as well as second metatarsal Weil shortening osteotomy. DOS: 08/02/2021.  Patient states that she has been doing very well this week she has reduced her activity and stayed off of her foot as indicated.  She is also been icing her foot.  Patient states that she has been much more compliant this week.  Past Medical History:  Diagnosis Date   Depression    Primary hyperthyroidism       Objective/Physical Exam Neurovascular status intact.  Skin incisions appear to be well coapted with sutures intact. No sign of infectious process noted. No dehiscence. No active bleeding noted.  There is a heavy amount of edema noted throughout the foot likely due to the patient's noncompliance and excessive walking throughout the past week and not reducing her activity.  Radiographic Exam RT foot 08/08/2021:  Orthopedic hardware and osteotomies sites appear to be stable with routine healing.  Slight bending of the K wire noted second ray  Assessment: 1. s/p right forefoot surgery. DOS: 08/02/2021   Plan of Care:  1. Patient was evaluated.  2.  Overall there is significant improvement.  The edema appears to be improving significantly.  There is no drainage along the incision sites.  Patient states she is done very well staying off the foot this week and minimizing her activities 3.  Patient may begin washing and showering and getting the foot wet 4.  Continue minimal weightbearing as tolerated in the cam boot with the assistance of crutches.  A new cam boot was dispensed today. 5.  Return to clinic in 1 week for suture removal   Felecia Shelling, DPM [facility]  Dr. Felecia Shelling, DPM    [address] [place], [place] [address]                Office [phone]  Fax [phone]

## 2021-08-29 ENCOUNTER — Encounter: Payer: 59 | Admitting: Podiatry

## 2021-09-05 ENCOUNTER — Ambulatory Visit (INDEPENDENT_AMBULATORY_CARE_PROVIDER_SITE_OTHER): Payer: 59

## 2021-09-05 ENCOUNTER — Ambulatory Visit (INDEPENDENT_AMBULATORY_CARE_PROVIDER_SITE_OTHER): Payer: 59 | Admitting: Podiatry

## 2021-09-05 ENCOUNTER — Other Ambulatory Visit: Payer: Self-pay

## 2021-09-05 DIAGNOSIS — Q6689 Other  specified congenital deformities of feet: Secondary | ICD-10-CM | POA: Diagnosis not present

## 2021-09-05 DIAGNOSIS — M21622 Bunionette of left foot: Secondary | ICD-10-CM

## 2021-09-05 DIAGNOSIS — M2012 Hallux valgus (acquired), left foot: Secondary | ICD-10-CM

## 2021-09-05 DIAGNOSIS — Z9889 Other specified postprocedural states: Secondary | ICD-10-CM

## 2021-09-05 NOTE — Progress Notes (Signed)
Subjective:  Patient presents today status post right bunionectomy with double osteotomy and tailor's bunionectomy as well as second metatarsal Weil shortening osteotomy. DOS: 08/02/2021.  Patient states that she is doing very well.  She has reduced her activity and is weightbearing as tolerated in the cam boot.  Patient states that she would like to have her left foot surgery performed as soon as reasonably possible so that she can heal in order to be back to activity for the summer.  Past Medical History:  Diagnosis Date   Depression    Primary hyperthyroidism    Past Surgical History:  Procedure Laterality Date   ABDOMINAL AORTIC ENDOVASCULAR STENT GRAFT N/A 10/15/2019   Procedure: ABDOMINAL AORTIC ENDOVASCULAR STENT GRAFT;  Surgeon: Cephus Shelling, MD;  Location: [facility] OR;  Service: Vascular;  Laterality: N/A;   BRAIN SURGERY     CESAREAN SECTION     IR RENAL SELECTIVE  UNI INC S&I MOD SED  10/15/2019   IR US GUIDE VASC ACCESS RIGHT  10/15/2019   IR US GUIDE VASC ACCESS RIGHT  10/15/2019   IR VENOCAVAGRAM IVC  10/15/2019   IR VENOGRAM RENAL UNI LEFT  10/15/2019   Allergies  Allergen Reactions   Codeine Hives, Itching and Swelling   Fentanyl Citrate Itching   Hydrocodone-Acetaminophen Hives, Itching and Swelling   Oxycodone-Acetaminophen Hives, Itching, Nausea And Vomiting and Swelling   Penicillins Hives, Itching and Swelling   Tramadol Hives, Itching and Swelling     Objective/Physical Exam Neurovascular status intact.  Skin incisions appear to be well coapted with sutures intact.  Moderate edema noted.  No erythema or clinical evidence of an infection.  Overall his skin incisions appear to be well coapted and the percutaneous fixation pin is intact  Hallux valgus deformity also noted with a prominent fifth metatarsal head also noted to the left foot.  Elongated second digit beyond the normal digital parabola of the foot consistent with a Morton's toe  deformity  Radiographic Exam RT foot  Osteotomy sites appear to be healing routinely.  Orthopedic hardware is intact.  Percutaneous fixation pin removed.  Good alignment of the first ray.  Radiographic exam LT foot Increased intermetatarsal angle with an increased hallux abductus angle greater than 15 degrees and 30 degrees respectively noted to the left foot consistent with hallux valgus.  Elongated second metatarsal and second digit overall noted extending beyond the normal parabola.  Increased intermetatarsal space between the fourth and fifth metatarsals also noted with lateral deviation of the fifth metatarsal head consistent with a tailor's bunion deformity  Assessment: 1. s/p right forefoot surgery. DOS: 08/02/2021 2.  Hallux valgus left 3.  Tailor's bunion left 4.  Elongated second digit left   Plan of Care:  1. Patient was evaluated.  2.  Sutures and percutaneous fixation pin to the right foot was removed today 3.  Continue weightbearing in the cam boot for an additional 2 weeks, then she can transition out of the cam boot into good supportive shoes and sneakers 4.  The patient would like to have the left foot surgery performed soon as possible.  She suffers from the same symptoms that we had surgery for the right foot.  This includes bunion, tailor's bunion, and elongated symptomatic second digit to the left foot.  She would like to have the same procedures performed.  So far she is very satisfied with the right foot surgery 5. Today we discussed the conservative versus surgical management of the presenting pathology. The  patient opts for surgical management. All possible complications and details of the procedure were explained. All patient questions were answered. No guarantees were expressed or implied. 6. Authorization for surgery was initiated today. Surgery will consist of bunionectomy with double osteotomy left.  Tailor's bunionectomy with osteotomy left.  Weil decompression  shortening osteotomy second metatarsal left. 7. Return to clinic 1 week postop   Felecia Shelling, DPM [facility]  Dr. Felecia Shelling, DPM    [address] [place], [place] [address]                Office [phone]  Fax [phone]

## 2021-09-12 ENCOUNTER — Other Ambulatory Visit: Payer: Self-pay

## 2021-09-12 ENCOUNTER — Ambulatory Visit: Payer: 59 | Admitting: Podiatry

## 2021-09-12 DIAGNOSIS — Z9889 Other specified postprocedural states: Secondary | ICD-10-CM

## 2021-09-12 NOTE — Progress Notes (Signed)
° °  Subjective:  Patient presents today status post right bunionectomy with double osteotomy and tailor's bunionectomy as well as second metatarsal Weil shortening osteotomy. DOS: 08/02/2021.  Patient states that there was some confusion since last visit.  She is unsure how long to wear the cam boot for.  She presents for recommendations and follow-up  Past Medical History:  Diagnosis Date   Depression    Primary hyperthyroidism    Past Surgical History:  Procedure Laterality Date   ABDOMINAL AORTIC ENDOVASCULAR STENT GRAFT N/A 10/15/2019   Procedure: ABDOMINAL AORTIC ENDOVASCULAR STENT GRAFT;  Surgeon: Cephus Shelling, MD;  Location: [facility] OR;  Service: Vascular;  Laterality: N/A;   BRAIN SURGERY     CESAREAN SECTION     IR RENAL SELECTIVE  UNI INC S&I MOD SED  10/15/2019   IR US GUIDE VASC ACCESS RIGHT  10/15/2019   IR US GUIDE VASC ACCESS RIGHT  10/15/2019   IR VENOCAVAGRAM IVC  10/15/2019   IR VENOGRAM RENAL UNI LEFT  10/15/2019   Allergies  Allergen Reactions   Codeine Hives, Itching and Swelling   Fentanyl Citrate Itching   Hydrocodone-Acetaminophen Hives, Itching and Swelling   Oxycodone-Acetaminophen Hives, Itching, Nausea And Vomiting and Swelling   Penicillins Hives, Itching and Swelling   Tramadol Hives, Itching and Swelling     Objective/Physical Exam Neurovascular status intact.  Skin incisions appear to be well coapted and healed.  There continues to be some moderate edema.  Overall doing very well  Assessment: 1. s/p right forefoot surgery. DOS: 08/02/2021 2.  Hallux valgus left 3.  Tailor's bunion left 4.  Elongated second digit left   Plan of Care:  1. Patient was evaluated.  2.  Patient may continue weightbearing in the cam boot for an additional week.  Then she can transition out of the cam boot into good supportive shoes and sneakers. 3.  Return to clinic 1 week postop after left foot surgery.  Authorization for surgery was obtained last visit on  09/05/2021  Felecia Shelling, DPM [facility]  Dr. Felecia Shelling, DPM    [address] [place], [place] [address]                Office [phone]  Fax [phone]

## 2021-09-17 ENCOUNTER — Telehealth: Payer: Self-pay | Admitting: Urology

## 2021-09-17 NOTE — Telephone Encounter (Signed)
DOS - 10/18/21  DOUBLE OSTEOTOMY LEFT --- 28299 METATARSAL OSTEOTOMY 2,5 LEFT --- 62694  [employer] EFFECTIVE DATE - 09/02/21   SPOKE WITH ROSE WITH [employer] AND SHE STATED THAT FOR CPT CODES 85462 AND 70350 NO PRIOR AUTH IS REQUIRED.  REF # [id number]

## 2021-09-22 ENCOUNTER — Other Ambulatory Visit: Payer: Self-pay | Admitting: Podiatry

## 2021-10-16 DIAGNOSIS — J019 Acute sinusitis, unspecified: Secondary | ICD-10-CM | POA: Diagnosis not present

## 2021-10-16 DIAGNOSIS — Z6823 Body mass index (BMI) 23.0-23.9, adult: Secondary | ICD-10-CM | POA: Diagnosis not present

## 2021-10-24 ENCOUNTER — Encounter: Payer: 59 | Admitting: Podiatry

## 2021-10-31 ENCOUNTER — Encounter: Payer: 59 | Admitting: Podiatry

## 2021-11-14 ENCOUNTER — Encounter: Payer: 59 | Admitting: Podiatry

## 2021-11-26 ENCOUNTER — Other Ambulatory Visit: Payer: Self-pay | Admitting: Podiatry

## 2021-11-28 ENCOUNTER — Encounter: Payer: 59 | Admitting: Podiatry

## 2021-12-05 ENCOUNTER — Encounter: Payer: 59 | Admitting: Podiatry

## 2021-12-19 ENCOUNTER — Encounter: Payer: 59 | Admitting: Podiatry

## 2021-12-21 ENCOUNTER — Other Ambulatory Visit: Payer: Self-pay

## 2021-12-21 DIAGNOSIS — I713 Abdominal aortic aneurysm, ruptured, unspecified: Secondary | ICD-10-CM

## 2021-12-25 NOTE — Progress Notes (Signed)
?Office Note  ? ? ? ?CC:  follow up ?Requesting Provider:  Lovey Newcomer, PA ? ?HPI: Shelley Hines is a 56 y.o. ([birth year]) female who presents for routine follow up EVAR. She is s/p infrarenal aortic stent graft repair using two aortic cuffs on 10/15/2019 by Dr. Chestine Spore. Extravasation was identified on Angiography by IR and repair was indicated. Felt to be likely traumatic cause. She did have post operative RP hematoma which since resolved. At her last visit in April of 2022 she was doing well and CT showed excellent position of the stent graft. ? ?She returns today for follow up with EVAR duplex. She denies any abdominal pain, back pain, lower extremity pain on ambulation or rest. She has no tissue loss. She does get cramps in her legs at night. She is also concerned about some varicose veins she has started to notice in her left> right thigh. She does have some tenderness in the area of these veins from time to time. She denies any swelling in her legs. She remains very active without any lower extremity symptoms.  ? ?The pt is not on a statin for cholesterol management.  ?The pt is on a daily aspirin.   Other AC: none ?The pt is not on medications for hypertension.   ?The pt is not diabetic.   ?Tobacco hx:  never ? ?Past Medical History:  ?Diagnosis Date  ? Depression   ? Primary hyperthyroidism   ? ? ?Past Surgical History:  ?Procedure Laterality Date  ? ABDOMINAL AORTIC ENDOVASCULAR STENT GRAFT N/A 10/15/2019  ? Procedure: ABDOMINAL AORTIC ENDOVASCULAR STENT GRAFT;  Surgeon: Cephus Shelling, MD;  Location: [facility] OR;  Service: Vascular;  Laterality: N/A;  ? BRAIN SURGERY    ? CESAREAN SECTION    ? IR RENAL SELECTIVE  UNI INC S&I MOD SED  10/15/2019  ? IR US GUIDE VASC ACCESS RIGHT  10/15/2019  ? IR US GUIDE VASC ACCESS RIGHT  10/15/2019  ? IR VENOCAVAGRAM IVC  10/15/2019  ? IR VENOGRAM RENAL UNI LEFT  10/15/2019  ? ? ?Social History  ? ?Socioeconomic History  ? Marital status: Divorced  ?  Spouse name: Not on  file  ? Number of children: Not on file  ? Years of education: Not on file  ? Highest education level: Not on file  ?Occupational History  ? Not on file  ?Tobacco Use  ? Smoking status: Never  ?  Passive exposure: Never  ? Smokeless tobacco: Never  ?Vaping Use  ? Vaping Use: Some days  ? Devices: marijuana  ?Substance and Sexual Activity  ? Alcohol use: Not Currently  ?  Comment: occasionally  ? Drug use: Not Currently  ?  Types: Marijuana  ? Sexual activity: Yes  ?  Birth control/protection: None  ?Other Topics Concern  ? Not on file  ?Social History Narrative  ? Not on file  ? ?Social Determinants of Health  ? ?Financial Resource Strain: Not on file  ?Food Insecurity: Not on file  ?Transportation Needs: Not on file  ?Physical Activity: Not on file  ?Stress: Not on file  ?Social Connections: Not on file  ?Intimate Partner Violence: Not on file  ? ? ?Family History  ?Problem Relation Age of Onset  ? Thyroid disease Neg Hx   ? ? ?Current Outpatient Medications  ?Medication Sig Dispense Refill  ? aspirin EC 81 MG EC tablet Take 1 tablet (81 mg total) by mouth daily at 6 (six) AM. 30 tablet 0  ?  busPIRone (BUSPAR) 5 MG tablet Take 1 tablet (5 mg total) by mouth 2 (two) times daily. For anxiety 60 tablet 0  ? ibuprofen (ADVIL) 800 MG tablet TAKE 1 TABLET BY MOUTH THREE TIMES A DAY 90 tablet 1  ? ?No current facility-administered medications for this visit.  ? ? ?Allergies  ?Allergen Reactions  ? Codeine Hives, Itching and Swelling  ? Fentanyl Citrate Itching  ? Hydrocodone-Acetaminophen Hives, Itching and Swelling  ? Oxycodone-Acetaminophen Hives, Itching, Nausea And Vomiting and Swelling  ? Penicillins Hives, Itching and Swelling  ? Tramadol Hives, Itching and Swelling  ? ? ? ?REVIEW OF SYSTEMS:  ?[X]  denotes positive finding, [ ]  denotes negative finding ?Cardiac  Comments:  ?Chest pain or chest pressure:    ?Shortness of breath upon exertion:    ?Short of breath when lying flat:    ?Irregular heart rhythm:    ?     ?Vascular    ?Pain in calf, thigh, or hip brought on by ambulation:    ?Pain in feet at night that wakes you up from your sleep:     ?Blood clot in your veins:    ?Leg swelling:     ?    ?Pulmonary    ?Oxygen at home:    ?Productive cough:     ?Wheezing:     ?    ?Neurologic    ?Sudden weakness in arms or legs:     ?Sudden numbness in arms or legs:     ?Sudden onset of difficulty speaking or slurred speech:    ?Temporary loss of vision in one eye:     ?Problems with dizziness:     ?    ?Gastrointestinal    ?Blood in stool:     ?Vomited blood:     ?    ?Genitourinary    ?Burning when urinating:     ?Blood in urine:    ?    ?Psychiatric    ?Major depression:     ?    ?Hematologic    ?Bleeding problems:    ?Problems with blood clotting too easily:    ?    ?Skin    ?Rashes or ulcers:    ?    ?Constitutional    ?Fever or chills:    ? ? ?PHYSICAL EXAMINATION: ? ?Vitals:  ? 01/02/22 0835  ?BP: 122/75  ?Pulse: (!) 52  ?Resp: 20  ?Temp: (!) 97.3 ?F (36.3 ?C)  ?TempSrc: Temporal  ?SpO2: 100%  ?Weight: 155 lb 3.2 oz (70.4 kg)  ?Height: 5\' 8"  (1.727 m)  ? ? ?General:  WDWN in NAD; vital signs documented above ?Gait: Normal ?HENT: WNL, normocephalic ?Pulmonary: normal non-labored breathing , without  wheezing ?Cardiac: regular HR, without  Murmurs without carotid bruit ?Abdomen: soft, NT, no masses ?Vascular Exam/Pulses: ? Right Left  ?Radial 2+ (normal) 2+ (normal)  ?Femoral 2+ (normal) 2+ (normal)  ?Popliteal 2+ (normal) 2+ (normal)  ?DP 2+ (normal) 2+ (normal)  ?PT 2+ (normal) 2+ (normal)  ? ?Extremities: without ischemic changes, without Gangrene , without cellulitis; without open wounds; Varicose veins left medial thigh > right medial thigh  ?Musculoskeletal: no muscle wasting or atrophy  ?Neurologic: A&O X 3;  No focal weakness or paresthesias are detected ?Psychiatric:  The pt has Normal affect. ? ? ?Non-Invasive Vascular Imaging:   ?Endovascular Aortic Repair (EVAR):   ?+---------+----------------+-------------------+-------------------+  ?         Diameter AP (cm)Diameter Trans (cm)Velocities (cm/sec)  ?+---------+----------------+-------------------+-------------------+  ?Aorta  2.20            2.33               89                   ?+---------+----------------+-------------------+-------------------+  ?Right CIA0.91            0.97               104                  ?+---------+----------------+-------------------+-------------------+  ?Left CIA 0.86            0.87               125                  ?+---------+----------------+-------------------+-------------------+  ? ?Summary:  ?Abdominal Aorta: Patent endovascular aneurysm repair with no evidence of endoleak.  ? ? ?ASSESSMENT/PLAN:: 56 y.o. female here for routine follow up after EVAR. She is s/p infrarenal aortic stent graft repair using two aortic cuffs on 10/15/2019 by Dr. Chestine Sporelark. Extravasation was identified on Angiography by IR and repair was indicated. Felt to be likely traumatic cause. She is without any attributable symptoms after EVAR. Duplex today shows patient EVAR without evidence of endoleak.  ?- Likely has some venous insufficiency with some varicose veins. She would like these evaluated. I will arrange duplex in hear future ?- Continue Aspirin ?- She will have EVAR duplex in 1 year ?- She will follow up over next 1 month with venous reflux study of LLE ? ? ?Graceann Congressorrina Callum Wolf, PA-C ?[facility] ?[phone] ? ?Clinic MD:   [facility] ?

## 2022-01-02 ENCOUNTER — Ambulatory Visit (HOSPITAL_COMMUNITY)
Admission: RE | Admit: 2022-01-02 | Discharge: 2022-01-02 | Disposition: A | Discharge status: Home / Self Care | Payer: 59 | Source: Ambulatory Visit | Attending: Vascular Surgery | Admitting: Vascular Surgery

## 2022-01-02 ENCOUNTER — Ambulatory Visit: Payer: 59 | Admitting: Physician Assistant

## 2022-01-02 VITALS — BP 122/75 | HR 52 | Temp 97.3°F | Resp 20 | Ht 68.0 in | Wt 155.2 lb

## 2022-01-02 DIAGNOSIS — I713 Abdominal aortic aneurysm, ruptured, unspecified: Secondary | ICD-10-CM | POA: Diagnosis not present

## 2022-01-04 ENCOUNTER — Other Ambulatory Visit: Payer: Self-pay | Admitting: *Deleted

## 2022-01-04 DIAGNOSIS — M79605 Pain in left leg: Secondary | ICD-10-CM

## 2022-02-01 ENCOUNTER — Ambulatory Visit (HOSPITAL_COMMUNITY)
Admission: RE | Admit: 2022-02-01 | Discharge: 2022-02-01 | Disposition: A | Discharge status: Home / Self Care | Payer: 59 | Source: Ambulatory Visit | Attending: Vascular Surgery | Admitting: Vascular Surgery

## 2022-02-01 DIAGNOSIS — M79605 Pain in left leg: Secondary | ICD-10-CM | POA: Diagnosis not present

## 2022-02-05 DIAGNOSIS — Z0000 Encounter for general adult medical examination without abnormal findings: Secondary | ICD-10-CM | POA: Diagnosis not present

## 2022-02-05 DIAGNOSIS — Z1239 Encounter for other screening for malignant neoplasm of breast: Secondary | ICD-10-CM | POA: Diagnosis not present

## 2022-02-05 DIAGNOSIS — Z6823 Body mass index (BMI) 23.0-23.9, adult: Secondary | ICD-10-CM | POA: Diagnosis not present

## 2022-02-21 ENCOUNTER — Other Ambulatory Visit: Payer: Self-pay | Admitting: Physician Assistant

## 2022-02-21 DIAGNOSIS — Z1231 Encounter for screening mammogram for malignant neoplasm of breast: Secondary | ICD-10-CM

## 2022-02-22 ENCOUNTER — Other Ambulatory Visit: Payer: Self-pay | Admitting: Podiatry

## 2022-03-24 ENCOUNTER — Other Ambulatory Visit: Payer: Self-pay | Admitting: Podiatry

## 2022-03-26 ENCOUNTER — Ambulatory Visit
Admission: RE | Admit: 2022-03-26 | Discharge: 2022-03-26 | Disposition: A | Discharge status: Home / Self Care | Payer: 59 | Source: Ambulatory Visit | Attending: Physician Assistant | Admitting: Physician Assistant

## 2022-03-26 DIAGNOSIS — Z1231 Encounter for screening mammogram for malignant neoplasm of breast: Secondary | ICD-10-CM

## 2022-04-08 DIAGNOSIS — Z6822 Body mass index (BMI) 22.0-22.9, adult: Secondary | ICD-10-CM | POA: Diagnosis not present

## 2022-04-08 DIAGNOSIS — R030 Elevated blood-pressure reading, without diagnosis of hypertension: Secondary | ICD-10-CM | POA: Diagnosis not present

## 2022-04-08 DIAGNOSIS — Z8616 Personal history of COVID-19: Secondary | ICD-10-CM | POA: Diagnosis not present

## 2022-04-08 DIAGNOSIS — R41840 Attention and concentration deficit: Secondary | ICD-10-CM | POA: Diagnosis not present

## 2022-05-01 ENCOUNTER — Other Ambulatory Visit: Payer: Self-pay | Admitting: Podiatry

## 2022-06-21 DIAGNOSIS — G4700 Insomnia, unspecified: Secondary | ICD-10-CM | POA: Diagnosis not present

## 2022-06-21 DIAGNOSIS — Z8249 Family history of ischemic heart disease and other diseases of the circulatory system: Secondary | ICD-10-CM | POA: Diagnosis not present

## 2022-06-21 DIAGNOSIS — Z7982 Long term (current) use of aspirin: Secondary | ICD-10-CM | POA: Diagnosis not present

## 2022-06-21 DIAGNOSIS — R69 Illness, unspecified: Secondary | ICD-10-CM | POA: Diagnosis not present

## 2022-06-21 DIAGNOSIS — Z880 Allergy status to penicillin: Secondary | ICD-10-CM | POA: Diagnosis not present

## 2022-06-21 DIAGNOSIS — Z885 Allergy status to narcotic agent status: Secondary | ICD-10-CM | POA: Diagnosis not present

## 2022-06-21 DIAGNOSIS — F902 Attention-deficit hyperactivity disorder, combined type: Secondary | ICD-10-CM | POA: Diagnosis not present

## 2022-06-21 DIAGNOSIS — Z87892 Personal history of anaphylaxis: Secondary | ICD-10-CM | POA: Diagnosis not present

## 2022-07-08 ENCOUNTER — Ambulatory Visit: Payer: 59 | Admitting: Podiatry

## 2022-07-08 DIAGNOSIS — M2012 Hallux valgus (acquired), left foot: Secondary | ICD-10-CM | POA: Diagnosis not present

## 2022-07-08 NOTE — Progress Notes (Signed)
Chief Complaint  Patient presents with   surgical consult    Patient is here for left foot bunion surgical consultation.    Subjective: 56 y.o. female presents today for surgical consultation of left foot bunion.  Originally consent was performed earlier this year in January but the patient states that she is now ready for surgery.  She does have history of bunionectomy with tailor's bunionectomy and hammertoe repair of the right foot with good routine healing.  Patient states today that she would only like to have her bunion treated and not her tailor's bunion or hammertoe to the left foot.  She presents for further treatment and evaluation  Past Medical History:  Diagnosis Date   Depression    Primary hyperthyroidism     Past Surgical History:  Procedure Laterality Date   ABDOMINAL AORTIC ENDOVASCULAR STENT GRAFT N/A 10/15/2019   Procedure: ABDOMINAL AORTIC ENDOVASCULAR STENT GRAFT;  Surgeon: Marty Heck, MD;  Location: [facility] OR;  Service: Vascular;  Laterality: N/A;   BRAIN SURGERY     CESAREAN SECTION     IR RENAL SELECTIVE  UNI INC S&I MOD SED  10/15/2019   IR US GUIDE VASC ACCESS RIGHT  10/15/2019   IR US GUIDE VASC ACCESS RIGHT  10/15/2019   IR VENOCAVAGRAM IVC  10/15/2019   IR VENOGRAM RENAL UNI LEFT  10/15/2019    Allergies  Allergen Reactions   Codeine Hives, Itching and Swelling   Fentanyl Citrate Itching   Hydrocodone-Acetaminophen Hives, Itching and Swelling   Oxycodone-Acetaminophen Hives, Itching, Nausea And Vomiting and Swelling   Penicillins Hives, Itching and Swelling   Tramadol Hives, Itching and Swelling     Objective: Physical Exam General: The patient is alert and oriented x3 in no acute distress.  Dermatology: Skin is cool, dry and supple bilateral lower extremities. Negative for open lesions or macerations.  Vascular: Palpable pedal pulses bilaterally. No edema or erythema noted. Capillary refill within normal limits.  Neurological:  Epicritic and protective threshold grossly intact bilaterally.   Musculoskeletal Exam: Clinical evidence of bunion deformity noted to the respective foot. There is moderate pain on palpation range of motion of the first MPJ. Lateral deviation of the hallux noted consistent with hallux abductovalgus.  Radiographic Exam LT foot 09/05/2021: Normal osseous mineralization.  No acute fractures identified.  Increased intermetatarsal angle greater than 15 with a hallux abductus angle greater than 30 noted on AP view.   Assessment: 1.  Hallux valgus left 2.  PSxHx RT bunionectomy, tailor's bunionectomy, and hammertoe repair.  DOS: 08/02/2021   Plan of Care:  1. Patient was evaluated. X-Rays reviewed. 2.  Today we discussed the risk benefits advantages and disadvantages regarding bunionectomy to the left foot.  Patient continues to have pain and tenderness on a daily basis despite conservative treatment modalities including shoe gear modifications.  She is ready to have left foot surgery performed.  The postoperative recovery course and surgery in detail was explained.  All patient questions answered.  No guarantees were expressed or implied. 3.  Authorization for surgery was initiated today.  Surgery will consist of bunionectomy with osteotomy left 4.  Return to clinic 1 week postop   Edrick Kins, DPM [facility]  Dr. Edrick Kins, DPM    [address]  [place], [place] [address]                Office [phone]  Fax [phone]

## 2022-07-16 ENCOUNTER — Telehealth: Payer: Self-pay | Admitting: Podiatry

## 2022-07-16 NOTE — Telephone Encounter (Signed)
DOS: 08/15/2022  [employer]   Double Osteotomy Lt ([id number])  DX: M20.12 and M20.42  Deductible: $0 Out-of-Pocket: $2,800 with $9,794.80 remaining CoInsurance: 0%  Prior authorization is not required per [facility].  Call Reference #: [id number]

## 2022-07-28 ENCOUNTER — Other Ambulatory Visit: Payer: Self-pay | Admitting: Podiatry

## 2022-07-29 ENCOUNTER — Ambulatory Visit: Payer: 59 | Admitting: Podiatry

## 2022-07-29 DIAGNOSIS — M2012 Hallux valgus (acquired), left foot: Secondary | ICD-10-CM

## 2022-07-29 NOTE — Progress Notes (Signed)
Chief Complaint  Patient presents with   surgery consultation    Patient states that she is here to sign her surgical documentation.    Subjective: 56 y.o. female presents today for surgical consultation of left foot bunion.  Originally consent was performed earlier this year in January and last visit she sedated that she would only like to have the hallux valgus bunion deformity treated and not her tailor's bunion or hammertoe.  Today she states that she has had pain and tenderness associated to these areas and she would like them all corrected for, the same as her contralateral limb.  She has had good success with her right foot surgery and would like to have the same procedures performed to correct for her symptomatic bunion, tailor's bunion, and hammertoe.  Past Medical History:  Diagnosis Date   Depression    Primary hyperthyroidism     Past Surgical History:  Procedure Laterality Date   ABDOMINAL AORTIC ENDOVASCULAR STENT GRAFT N/A 10/15/2019   Procedure: ABDOMINAL AORTIC ENDOVASCULAR STENT GRAFT;  Surgeon: Cephus Shelling, MD;  Location: [facility] OR;  Service: Vascular;  Laterality: N/A;   BRAIN SURGERY     CESAREAN SECTION     IR RENAL SELECTIVE  UNI INC S&I MOD SED  10/15/2019   IR US GUIDE VASC ACCESS RIGHT  10/15/2019   IR US GUIDE VASC ACCESS RIGHT  10/15/2019   IR VENOCAVAGRAM IVC  10/15/2019   IR VENOGRAM RENAL UNI LEFT  10/15/2019    Allergies  Allergen Reactions   Codeine Hives, Itching and Swelling   Fentanyl Citrate Itching   Hydrocodone-Acetaminophen Hives, Itching and Swelling   Oxycodone-Acetaminophen Hives, Itching, Nausea And Vomiting and Swelling   Penicillins Hives, Itching and Swelling   Tramadol Hives, Itching and Swelling     Objective: Physical Exam General: The patient is alert and oriented x3 in no acute distress.  Dermatology: Skin is cool, dry and supple bilateral lower extremities. Negative for open lesions or macerations.  Vascular:  Palpable pedal pulses bilaterally. No edema or erythema noted. Capillary refill within normal limits.  Neurological: Epicritic and protective threshold grossly intact bilaterally.   Musculoskeletal Exam: Clinical evidence of bunion and tailor's bunionette deformity noted to the respective foot with hammertoe contracture of the second digit. There is moderate pain on palpation throughout the areas. Lateral deviation of the hallux noted consistent with hallux abductovalgus.  Radiographic Exam LT foot 09/05/2021: Normal osseous mineralization.  No acute fractures identified.  Increased intermetatarsal angle greater than 15 with a hallux abductus angle greater than 30 noted on AP view.  Increased intermetatarsal angle between the fourth and fifth metatarsals with lateral deviation of the metatarsal head and a hypertrophic fifth metatarsal head.  There is some contracture of the second digit consistent with hammertoe deformity with elongated second metatarsal extending beyond the metatarsal parabola.  Assessment: 1.  Hallux valgus left 2.  Tailor's bunionette left  3.  Symptomatic elongated second metatarsal left  4.  PSxHx RT bunionectomy, tailor's bunionectomy, and hammertoe repair.  DOS: 08/02/2021   Plan of Care:  1. Patient was evaluated. X-Rays reviewed. 2.  Today again we discussed the risk benefits advantages and disadvantages regarding bunionectomy to the left foot.  Patient continues to have pain and tenderness on a daily basis despite conservative treatment modalities including shoe gear modifications.  She is ready to have left foot surgery performed.  The postoperative recovery course and surgery in detail was explained.  Minimal WBAT in a cam boot  time 6-8 weeks.  All patient questions answered.  No guarantees were expressed or implied. 3.  Authorization for surgery was reinitiated and adjusted today to include tailor's bunionectomy as well as Weil shortening osteotomy of the second  metatarsal.   4.  Return to clinic 1 week postop   Felecia Shelling, DPM [facility]  Dr. Felecia Shelling, DPM    [address] [place], [place] [address]                Office [phone]  Fax [phone]

## 2022-07-30 ENCOUNTER — Telehealth: Payer: Self-pay | Admitting: Podiatry

## 2022-07-30 NOTE — Telephone Encounter (Signed)
DOS: 08/15/2022  [employer] Effective 10/03/2021  [place] Bunionectomy Lt [phone]) Metatarsal Osteotomy 2nd and 5th Lt ([id number])  DX: M20.12 and M21.542  Deductible: $0 Out-of-Pocket: $2,800 with $3,888.75 remaining CoInsurance: 0%  Prior authorization is not required per automated system.  Call Reference #: [id number]

## 2022-08-14 ENCOUNTER — Encounter: Payer: 59 | Admitting: Podiatry

## 2022-08-15 ENCOUNTER — Other Ambulatory Visit: Payer: Self-pay | Admitting: Podiatry

## 2022-08-15 DIAGNOSIS — M2012 Hallux valgus (acquired), left foot: Secondary | ICD-10-CM | POA: Diagnosis not present

## 2022-08-15 DIAGNOSIS — M21622 Bunionette of left foot: Secondary | ICD-10-CM | POA: Diagnosis not present

## 2022-08-15 DIAGNOSIS — M21542 Acquired clubfoot, left foot: Secondary | ICD-10-CM | POA: Diagnosis not present

## 2022-08-15 DIAGNOSIS — G8918 Other acute postprocedural pain: Secondary | ICD-10-CM | POA: Diagnosis not present

## 2022-08-15 DIAGNOSIS — R69 Illness, unspecified: Secondary | ICD-10-CM | POA: Diagnosis not present

## 2022-08-15 DIAGNOSIS — M7742 Metatarsalgia, left foot: Secondary | ICD-10-CM | POA: Diagnosis not present

## 2022-08-15 MED ORDER — HYDROMORPHONE HCL 4 MG PO TABS: 4.0000 mg | ORAL_TABLET | ORAL | 0 refills | Status: AC | PRN

## 2022-08-15 MED ORDER — IBUPROFEN 800 MG PO TABS: 800.0000 mg | ORAL_TABLET | Freq: Three times a day (TID) | ORAL | 0 refills | Status: AC

## 2022-08-15 NOTE — Progress Notes (Signed)
PRN postop 

## 2022-08-16 ENCOUNTER — Telehealth: Payer: Self-pay | Admitting: *Deleted

## 2022-08-16 NOTE — Telephone Encounter (Signed)
Patient is wanting to know if she can resume regular medicines(buspirone, asp), please advise.

## 2022-08-16 NOTE — Telephone Encounter (Signed)
Yes, okay to resume regular medicines.  Thanks, Dr. Logan Bores

## 2022-08-19 NOTE — Telephone Encounter (Signed)
Called patient giving instructions to resume regular medicines, verbalized understanding.

## 2022-08-21 ENCOUNTER — Ambulatory Visit (INDEPENDENT_AMBULATORY_CARE_PROVIDER_SITE_OTHER): Payer: 59

## 2022-08-21 ENCOUNTER — Encounter: Payer: 59 | Admitting: Podiatry

## 2022-08-21 ENCOUNTER — Ambulatory Visit (INDEPENDENT_AMBULATORY_CARE_PROVIDER_SITE_OTHER): Payer: 59 | Admitting: Podiatry

## 2022-08-21 DIAGNOSIS — Z09 Encounter for follow-up examination after completed treatment for conditions other than malignant neoplasm: Secondary | ICD-10-CM

## 2022-08-21 NOTE — Progress Notes (Signed)
Patient seen in office today for POV #1 DOS 08/15/2022 Bunionectomy with osteotomy left. Patient denies any f,n,v,c,cp. Pain is minimal today. X-rays were obtained and reviewed by Dr.Regal as well as incision sites. No sign of infection at this time. Dressing was applied as followed 4x4 gauze over incision site, gauze wrap, secured by coban and stockinett. Patient is to continue using cam boot until next visit. Patient was advised to keep extremity elevated. Patient will follow up with next week for POV #2.

## 2022-08-28 ENCOUNTER — Ambulatory Visit (INDEPENDENT_AMBULATORY_CARE_PROVIDER_SITE_OTHER): Payer: 59 | Admitting: Podiatry

## 2022-08-28 DIAGNOSIS — Z09 Encounter for follow-up examination after completed treatment for conditions other than malignant neoplasm: Secondary | ICD-10-CM

## 2022-08-28 DIAGNOSIS — B351 Tinea unguium: Secondary | ICD-10-CM

## 2022-08-28 MED ORDER — TERBINAFINE HCL 250 MG PO TABS: 250.0000 mg | ORAL_TABLET | Freq: Every day | ORAL | 0 refills | Status: AC

## 2022-08-28 NOTE — Addendum Note (Signed)
Addended by: Felecia Shelling on: 08/28/2022 02:23 PM   Modules accepted: Orders

## 2022-08-28 NOTE — Progress Notes (Addendum)
   Chief Complaint  Patient presents with   Post-op Follow-up    Patient is here for post op follow-up left foot, DOS 08/15/22.the patient states that healing process is well.    Subjective:  Patient presents today status post left forefoot surgery.  Patient doing well.  Denies any appreciable pain or tenderness.  She only has some slight sensitivity and is no longer taking any pain medication.  She has been WBAT in the cam boot  Past Medical History:  Diagnosis Date   Depression    Primary hyperthyroidism     Past Surgical History:  Procedure Laterality Date   ABDOMINAL AORTIC ENDOVASCULAR STENT GRAFT N/A 10/15/2019   Procedure: ABDOMINAL AORTIC ENDOVASCULAR STENT GRAFT;  Surgeon: Cephus Shelling, MD;  Location: [facility] OR;  Service: Vascular;  Laterality: N/A;   BRAIN SURGERY     CESAREAN SECTION     IR RENAL SELECTIVE  UNI INC S&I MOD SED  10/15/2019   IR US GUIDE VASC ACCESS RIGHT  10/15/2019   IR US GUIDE VASC ACCESS RIGHT  10/15/2019   IR VENOCAVAGRAM IVC  10/15/2019   IR VENOGRAM RENAL UNI LEFT  10/15/2019    Allergies  Allergen Reactions   Codeine Hives, Itching and Swelling   Fentanyl Citrate Itching   Hydrocodone-Acetaminophen Hives, Itching and Swelling   Oxycodone-Acetaminophen Hives, Itching, Nausea And Vomiting and Swelling   Penicillins Hives, Itching and Swelling   Tramadol Hives, Itching and Swelling    Objective/Physical Exam Neurovascular status intact.  Incision well coapted with sutures intact. No sign of infectious process noted. No dehiscence. No active bleeding noted.  Minimal edema noted to the surgical extremity.  Hyperkeratotic dystrophic nails noted specifically to the right foot.  Findings consistent with onychomycosis of the toenails   Assessment: 1. s/p left forefoot surgery. DOS: 08/15/2022 2.  Fungal nail infection right hallux nail plate  Plan of Care:  1. Patient was evaluated. X-rays reviewed 2.  Continue WBAT cam boot 3.   Patient may begin washing and showering and getting the foot wet 4.  Today we discussed different treatment options for the toenail fungus including oral, topical, and laser antifungal treatment modalities.  Patient opts for oral antifungal treatment.  Risk benefits advantages and disadvantages of each modality were explained in detail.  Patient denies a history of liver pathology or symptoms  5.  Prescription for Lamisil 20 mg #90 daily  6.  Return to clinic 2 weeks for follow-up x-ray   Felecia Shelling, DPM [facility]  Dr. Felecia Shelling, DPM    [address] [place], [place] [address]                Office [phone]  Fax [phone]

## 2022-09-04 ENCOUNTER — Encounter: Payer: 59 | Admitting: Podiatry

## 2022-09-11 ENCOUNTER — Encounter: Payer: 59 | Admitting: Podiatry

## 2022-09-20 ENCOUNTER — Encounter: Payer: 59 | Admitting: Podiatry

## 2023-01-16 ENCOUNTER — Other Ambulatory Visit: Payer: Self-pay | Admitting: *Deleted

## 2023-01-16 DIAGNOSIS — I713 Abdominal aortic aneurysm, ruptured, unspecified: Secondary | ICD-10-CM

## 2023-01-28 ENCOUNTER — Ambulatory Visit (HOSPITAL_COMMUNITY)
Admission: RE | Admit: 2023-01-28 | Discharge: 2023-01-28 | Disposition: A | Discharge status: Home / Self Care | Payer: 59 | Source: Ambulatory Visit | Attending: Vascular Surgery | Admitting: Vascular Surgery

## 2023-01-28 ENCOUNTER — Ambulatory Visit: Payer: 59 | Admitting: Physician Assistant

## 2023-01-28 VITALS — BP 101/67 | Temp 97.6°F | Resp 18 | Ht 68.0 in | Wt 142.9 lb

## 2023-01-28 DIAGNOSIS — I713 Abdominal aortic aneurysm, ruptured, unspecified: Secondary | ICD-10-CM | POA: Diagnosis not present

## 2023-01-28 DIAGNOSIS — Z95828 Presence of other vascular implants and grafts: Secondary | ICD-10-CM

## 2023-01-28 NOTE — Progress Notes (Signed)
Office Note     CC:  follow up Requesting Provider:  Lovey Newcomer, PA  HPI: Shelley Hines is a 57 y.o. (05-20-1966) female who presents for routine follow up EVAR. She is s/p infrarenal aortic stent graft repair using two aortic cuffs on 10/15/2019 by Dr. Chestine Spore. Extravasation was identified on Angiography by IR and repair was indicated. In April of 2022 CT showed excellent position of the stent graft. She has been doing well without any associated symptoms. She had complained of cramps in her legs at night at her visit last year. She also was concerned about some varicose veins she had started to notice in her left> right thigh with some tenderness. I had recommended venous reflux study to further evaluate her symptoms. She had returned after her last visit to have the duplex done. It showed normal competency in her veins.    She returns today for follow up with EVAR duplex. She explains that she an episode of a "raw" feeling in her epigastric area. This did not last long and she associated it with something that she ate. It was improved with Tums. She has had no recurrence. She denies any other abdominal pain, back pain, lower extremity pain on ambulation or rest. She has no tissue loss. The cramping she was previously having is resolved. She denies any swelling in her legs. She remains very active without any lower extremity symptoms.    The pt is not on a statin for cholesterol management.  The pt is on a daily aspirin.   Other AC: none The pt is not on medications for hypertension.   The pt is not diabetic.   Tobacco hx:  never  Past Medical History:  Diagnosis Date   Depression    Primary hyperthyroidism     Past Surgical History:  Procedure Laterality Date   ABDOMINAL AORTIC ENDOVASCULAR STENT GRAFT N/A 10/15/2019   Procedure: ABDOMINAL AORTIC ENDOVASCULAR STENT GRAFT;  Surgeon: Cephus Shelling, MD;  Location: [facility] OR;  Service: Vascular;  Laterality: N/A;   BRAIN SURGERY      CESAREAN SECTION     IR RENAL SELECTIVE  UNI INC S&I MOD SED  10/15/2019   IR US GUIDE VASC ACCESS RIGHT  10/15/2019   IR US GUIDE VASC ACCESS RIGHT  10/15/2019   IR VENOCAVAGRAM IVC  10/15/2019   IR VENOGRAM RENAL UNI LEFT  10/15/2019    Social History   Socioeconomic History   Marital status: Divorced    Spouse name: Not on file   Number of children: Not on file   Years of education: Not on file   Highest education level: Not on file  Occupational History   Not on file  Tobacco Use   Smoking status: Never    Passive exposure: Never   Smokeless tobacco: Never  Vaping Use   Vaping Use: Some days   Devices: marijuana  Substance and Sexual Activity   Alcohol use: Not Currently    Comment: occasionally   Drug use: Not Currently    Types: Marijuana   Sexual activity: Yes    Birth control/protection: None  Other Topics Concern   Not on file  Social History Narrative   Not on file   Social Determinants of Health   Financial Resource Strain: Not on file  Food Insecurity: Not on file  Transportation Needs: Not on file  Physical Activity: Not on file  Stress: Not on file  Social Connections: Not on file  Intimate Partner Violence:  Not on file    Family History  Problem Relation Age of Onset   Thyroid disease Neg Hx     Current Outpatient Medications  Medication Sig Dispense Refill   aspirin EC 81 MG EC tablet Take 1 tablet (81 mg total) by mouth daily at 6 (six) AM. 30 tablet 0   busPIRone (BUSPAR) 5 MG tablet Take 1 tablet (5 mg total) by mouth 2 (two) times daily. For anxiety 60 tablet 0   HYDROmorphone (DILAUDID) 4 MG tablet Take 1 tablet (4 mg total) by mouth every 4 (four) hours as needed for severe pain. 30 tablet 0   ibuprofen (ADVIL) 800 MG tablet Take 1 tablet (800 mg total) by mouth 3 (three) times daily. 90 tablet 0   terbinafine (LAMISIL) 250 MG tablet Take 1 tablet (250 mg total) by mouth daily. 90 tablet 0   No current facility-administered  medications for this visit.    Allergies  Allergen Reactions   Codeine Hives, Itching and Swelling   Fentanyl Citrate Itching   Hydrocodone-Acetaminophen Hives, Itching and Swelling   Oxycodone-Acetaminophen Hives, Itching, Nausea And Vomiting and Swelling   Penicillins Hives, Itching and Swelling   Tramadol Hives, Itching and Swelling     REVIEW OF SYSTEMS:  [X]  denotes positive finding, [ ]  denotes negative finding Cardiac  Comments:  Chest pain or chest pressure:    Shortness of breath upon exertion:    Short of breath when lying flat:    Irregular heart rhythm:        Vascular    Pain in calf, thigh, or hip brought on by ambulation:    Pain in feet at night that wakes you up from your sleep:     Blood clot in your veins:    Leg swelling:         Pulmonary    Oxygen at home:    Productive cough:     Wheezing:         Neurologic    Sudden weakness in arms or legs:     Sudden numbness in arms or legs:     Sudden onset of difficulty speaking or slurred speech:    Temporary loss of vision in one eye:     Problems with dizziness:         Gastrointestinal    Blood in stool:     Vomited blood:         Genitourinary    Burning when urinating:     Blood in urine:        Psychiatric    Major depression:         Hematologic    Bleeding problems:    Problems with blood clotting too easily:        Skin    Rashes or ulcers:        Constitutional    Fever or chills:      PHYSICAL EXAMINATION:  Vitals:   01/28/23 0937  BP: 101/67  Resp: 18  Temp: 97.6 F (36.4 C)  TempSrc: Temporal  Weight: 142 lb 14.4 oz (64.8 kg)  Height: 5\' 8"  (1.727 m)    General:  WDWN in NAD; vital signs documented above Gait: Normal HENT: WNL, normocephalic Pulmonary: normal non-labored breathing , without  wheezing Cardiac: regular HR Abdomen: soft, NT, no masses. She does have a palpable aortic pulse Skin: without rashes Vascular Exam/Pulses: 2+ femoral, 2+ popliteal, 2+  Dp and PT pulses bilaterally Extremities: without ischemic changes, without Gangrene ,  without cellulitis; without open wounds;  Musculoskeletal: no muscle wasting or atrophy  Neurologic: A&O X 3 Psychiatric:  The pt has normal affect.   Non-Invasive Vascular Imaging:   Endovascular Aortic Repair (EVAR):  +----------+----------------+-------------------+-------------------+           Diameter AP (cm)Diameter Trans (cm)Velocities (cm/sec)  +----------+----------------+-------------------+-------------------+  Aorta    2.20            2.32               72                   +----------+----------------+-------------------+-------------------+  Right Limb0.94            1.11               111                  +----------+----------------+-------------------+-------------------+  Left Limb 0.77            0.86               125                  +----------+----------------+-------------------+-------------------+   Summary:  Abdominal Aorta: Patent endovascular aneurysm repair with no evidence of endoleak.      ASSESSMENT/PLAN:: 57 y.o. female here for follow up for EVAR. She is s/p infrarenal aortic stent graft repair using two aortic cuffs on 10/15/2019 by Dr. Chestine Spore.She has been doing well without any associated symptoms.  - Duplex today shows patent EVAR with no evidence of endoleak - continue Aspirin 81 mg - She will follow up in 1 year with repeat EVAR duplex   Graceann Congress, PA-C [facility] [phone]  Clinic MD:   Steve Rattler

## 2023-02-03 ENCOUNTER — Other Ambulatory Visit: Payer: Self-pay

## 2023-02-03 DIAGNOSIS — I713 Abdominal aortic aneurysm, ruptured, unspecified: Secondary | ICD-10-CM

## 2023-02-03 DIAGNOSIS — Z95828 Presence of other vascular implants and grafts: Secondary | ICD-10-CM

## 2023-02-05 DIAGNOSIS — R030 Elevated blood-pressure reading, without diagnosis of hypertension: Secondary | ICD-10-CM | POA: Diagnosis not present

## 2023-02-05 DIAGNOSIS — Z13220 Encounter for screening for lipoid disorders: Secondary | ICD-10-CM | POA: Diagnosis not present

## 2023-02-05 DIAGNOSIS — D62 Acute posthemorrhagic anemia: Secondary | ICD-10-CM | POA: Diagnosis not present

## 2023-02-05 DIAGNOSIS — R5383 Other fatigue: Secondary | ICD-10-CM | POA: Diagnosis not present

## 2023-02-05 DIAGNOSIS — E039 Hypothyroidism, unspecified: Secondary | ICD-10-CM | POA: Diagnosis not present

## 2023-03-19 DIAGNOSIS — Z6822 Body mass index (BMI) 22.0-22.9, adult: Secondary | ICD-10-CM | POA: Diagnosis not present

## 2023-03-19 DIAGNOSIS — J069 Acute upper respiratory infection, unspecified: Secondary | ICD-10-CM | POA: Diagnosis not present

## 2023-03-19 DIAGNOSIS — R030 Elevated blood-pressure reading, without diagnosis of hypertension: Secondary | ICD-10-CM | POA: Diagnosis not present

## 2023-03-28 ENCOUNTER — Other Ambulatory Visit: Payer: Self-pay | Admitting: Physician Assistant

## 2023-03-28 DIAGNOSIS — Z1231 Encounter for screening mammogram for malignant neoplasm of breast: Secondary | ICD-10-CM

## 2023-04-03 ENCOUNTER — Ambulatory Visit
Admission: RE | Admit: 2023-04-03 | Discharge: 2023-04-03 | Disposition: A | Discharge status: Home / Self Care | Payer: 59 | Source: Ambulatory Visit

## 2023-04-03 DIAGNOSIS — Z1231 Encounter for screening mammogram for malignant neoplasm of breast: Secondary | ICD-10-CM

## 2023-04-08 DIAGNOSIS — Z6823 Body mass index (BMI) 23.0-23.9, adult: Secondary | ICD-10-CM | POA: Diagnosis not present

## 2023-04-08 DIAGNOSIS — Z8616 Personal history of COVID-19: Secondary | ICD-10-CM | POA: Diagnosis not present

## 2023-04-08 DIAGNOSIS — R030 Elevated blood-pressure reading, without diagnosis of hypertension: Secondary | ICD-10-CM | POA: Diagnosis not present

## 2023-04-08 DIAGNOSIS — Z0000 Encounter for general adult medical examination without abnormal findings: Secondary | ICD-10-CM | POA: Diagnosis not present

## 2023-04-08 DIAGNOSIS — F329 Major depressive disorder, single episode, unspecified: Secondary | ICD-10-CM | POA: Diagnosis not present

## 2023-04-08 DIAGNOSIS — Z1331 Encounter for screening for depression: Secondary | ICD-10-CM | POA: Diagnosis not present

## 2023-04-08 DIAGNOSIS — Z1389 Encounter for screening for other disorder: Secondary | ICD-10-CM | POA: Diagnosis not present

## 2023-06-23 ENCOUNTER — Other Ambulatory Visit: Payer: Self-pay | Admitting: Podiatry

## 2024-02-20 ENCOUNTER — Ambulatory Visit

## 2024-03-16 ENCOUNTER — Ambulatory Visit

## 2024-03-16 ENCOUNTER — Other Ambulatory Visit (HOSPITAL_COMMUNITY)

## 2024-04-22 ENCOUNTER — Other Ambulatory Visit: Payer: Self-pay

## 2024-04-22 DIAGNOSIS — I713 Abdominal aortic aneurysm, ruptured, unspecified: Secondary | ICD-10-CM

## 2024-05-11 DIAGNOSIS — Z0000 Encounter for general adult medical examination without abnormal findings: Secondary | ICD-10-CM | POA: Diagnosis not present

## 2024-05-25 ENCOUNTER — Ambulatory Visit
Admission: RE | Admit: 2024-05-25 | Discharge: 2024-05-25 | Disposition: A | Discharge status: Home / Self Care | Source: Ambulatory Visit | Attending: Diagnostic Radiology | Admitting: Diagnostic Radiology

## 2024-05-25 ENCOUNTER — Other Ambulatory Visit: Payer: Self-pay | Admitting: Physician Assistant

## 2024-05-25 DIAGNOSIS — Z1231 Encounter for screening mammogram for malignant neoplasm of breast: Secondary | ICD-10-CM | POA: Diagnosis not present

## 2024-05-31 DIAGNOSIS — N951 Menopausal and female climacteric states: Secondary | ICD-10-CM | POA: Diagnosis not present

## 2024-05-31 DIAGNOSIS — N905 Atrophy of vulva: Secondary | ICD-10-CM | POA: Diagnosis not present

## 2024-05-31 DIAGNOSIS — R6882 Decreased libido: Secondary | ICD-10-CM | POA: Diagnosis not present

## 2024-05-31 DIAGNOSIS — Z01411 Encounter for gynecological examination (general) (routine) with abnormal findings: Secondary | ICD-10-CM | POA: Diagnosis not present

## 2024-06-01 ENCOUNTER — Ambulatory Visit (HOSPITAL_COMMUNITY)
Admission: RE | Admit: 2024-06-01 | Discharge: 2024-06-01 | Disposition: A | Discharge status: Home / Self Care | Source: Ambulatory Visit | Attending: Vascular Surgery | Admitting: Vascular Surgery

## 2024-06-01 ENCOUNTER — Ambulatory Visit (INDEPENDENT_AMBULATORY_CARE_PROVIDER_SITE_OTHER): Admitting: Physician Assistant

## 2024-06-01 VITALS — BP 123/71 | HR 51 | Temp 97.7°F | Resp 18 | Ht 68.0 in | Wt 157.0 lb

## 2024-06-01 DIAGNOSIS — Z95828 Presence of other vascular implants and grafts: Secondary | ICD-10-CM

## 2024-06-01 DIAGNOSIS — I713 Abdominal aortic aneurysm, ruptured, unspecified: Secondary | ICD-10-CM | POA: Insufficient documentation

## 2024-06-01 DIAGNOSIS — Z8679 Personal history of other diseases of the circulatory system: Secondary | ICD-10-CM

## 2024-06-01 NOTE — Progress Notes (Signed)
 Office Note   History of Present Illness   Shelley Hines is a 58 y.o. (Jul 14, 1966) female who presents for follow up of EVAR.  She has a history of infrarenal aortic stent graft repair using 2 aortic cuffs on 10/15/2019 by Dr. Gretta.  This was done for active extravasation from the infrarenal aorta after traumatic fall.   She returns today for follow-up.  She has no complaints at today's office visit.  She denies any new or worsening abdominal, back, or chest pain.  She says that she still notices her varicose veins in her thighs, left greater than right, however they are not bothersome.  She denies any lower extremity swelling.   Current Outpatient Medications  Medication Sig Dispense Refill   aspirin  EC 81 MG EC tablet Take 1 tablet (81 mg total) by mouth daily at 6 (six) AM. 30 tablet 0   busPIRone  (BUSPAR ) 5 MG tablet Take 1 tablet (5 mg total) by mouth 2 (two) times daily. For anxiety (Patient taking differently: Take 5 mg by mouth 2 (two) times daily as needed. For anxiety) 60 tablet 0   HYDROmorphone  (DILAUDID ) 4 MG tablet Take 1 tablet (4 mg total) by mouth every 4 (four) hours as needed for severe pain. 30 tablet 0   ibuprofen  (ADVIL ) 800 MG tablet Take 1 tablet (800 mg total) by mouth 3 (three) times daily. 90 tablet 0   terbinafine  (LAMISIL ) 250 MG tablet Take 1 tablet (250 mg total) by mouth daily. 90 tablet 0   No current facility-administered medications for this visit.    REVIEW OF SYSTEMS (negative unless checked):   Cardiac:  []  Chest pain or chest pressure? []  Shortness of breath upon activity? []  Shortness of breath when lying flat? []  Irregular heart rhythm?  Vascular:  []  Pain in calf, thigh, or hip brought on by walking? []  Pain in feet at night that wakes you up from your sleep? []  Blood clot in your veins? []  Leg swelling?  Pulmonary:  []  Oxygen at home? []  Productive cough? []  Wheezing?  Neurologic:  []  Sudden weakness in arms or legs? []  Sudden  numbness in arms or legs? []  Sudden onset of difficult speaking or slurred speech? []  Temporary loss of vision in one eye? []  Problems with dizziness?  Gastrointestinal:  []  Blood in stool? []  Vomited blood?  Genitourinary:  []  Burning when urinating? []  Blood in urine?  Psychiatric:  []  Major depression  Hematologic:  []  Bleeding problems? []  Problems with blood clotting?  Dermatologic:  []  Rashes or ulcers?  Constitutional:  []  Fever or chills?  Ear/Nose/Throat:  []  Change in hearing? []  Nose bleeds? []  Sore throat?  Musculoskeletal:  []  Back pain? []  Joint pain? []  Muscle pain?   Physical Examination   Vitals:   06/01/24 1028  BP: 123/71  Pulse: (!) 51  Resp: 18  Temp: 97.7 F (36.5 C)  TempSrc: Temporal  Weight: 157 lb (71.2 kg)  Height: 5' 8 (1.727 m)   Body mass index is 23.87 kg/m.  General:  WDWN in NAD; vital signs documented above Gait: Not observed HENT: WNL, normocephalic Pulmonary: normal non-labored breathing , without rales, rhonchi,  wheezing Cardiac: Regular Abdomen: soft, NT, no masses Skin: without rashes Vascular Exam/Pulses: Palpable radial and DP/PT pulses bilaterally Extremities: without ischemic changes, without gangrene , without cellulitis; without open wounds;  Musculoskeletal: no muscle wasting or atrophy  Neurologic: A&O X 3;  No focal weakness or paresthesias are detected Psychiatric:  The pt has  Normal affect.   Non-Invasive Vascular Imaging   EVAR duplex (06/01/2024) Current size: 2.62 Previous size: 2.32 cm (01/28/2023) R CIA: 1.15 cm L CIA: 1.07 cm   Medical Decision Making   Shelley Hines is a 58 y.o. ([birth year]) female who presents for surveillance of EVAR  Based on this patient's duplex, her aortic cuff repair remains patent without endoleak.  Current maximum diameter of the abdominal aorta is 2.62 cm She denies any new or worsening abdominal, back, or chest pain.  She also denies any lower  extremity pain at rest or when walking On exam she has palpable radial and DP/PT pulses bilaterally.  She has no abdominal tenderness.  Her aortic pulse is nonpalpable She can follow-up with our office in 1 year with repeat EVAR duplex   Ahmed Holster PA-C [facility] [place] Office: [phone]  Clinic MD: Miguel
